# Patient Record
Sex: Male | Born: 1955 | Race: White | Hispanic: No | Marital: Married | Smoking: Former smoker
Health system: Southern US, Community
[De-identification: ages and names within clinical notes are randomized; demographics above are authoritative.]

## PROBLEM LIST (undated history)

## (undated) DIAGNOSIS — I5022 Chronic systolic (congestive) heart failure: Secondary | ICD-10-CM

## (undated) DIAGNOSIS — J189 Pneumonia, unspecified organism: Secondary | ICD-10-CM

## (undated) DIAGNOSIS — B029 Zoster without complications: Secondary | ICD-10-CM

## (undated) DIAGNOSIS — E7800 Pure hypercholesterolemia, unspecified: Secondary | ICD-10-CM

## (undated) DIAGNOSIS — J449 Chronic obstructive pulmonary disease, unspecified: Secondary | ICD-10-CM

## (undated) DIAGNOSIS — I10 Essential (primary) hypertension: Secondary | ICD-10-CM

## (undated) HISTORY — DX: Chronic systolic (congestive) heart failure: I50.22

## (undated) HISTORY — DX: Pneumonia, unspecified organism: J18.9

## (undated) HISTORY — DX: Chronic obstructive pulmonary disease, unspecified: J44.9

## (undated) HISTORY — PX: CORONARY ANGIOPLASTY: SHX604

## (undated) HISTORY — DX: Essential (primary) hypertension: I10

## (undated) HISTORY — PX: CARDIAC CATHETERIZATION: SHX172

## (undated) HISTORY — DX: Zoster without complications: B02.9

## (undated) HISTORY — PX: CORONARY STENT PLACEMENT: SHX1402

## (undated) HISTORY — PX: URETHROTOMY: SHX1083

## (undated) HISTORY — DX: Pure hypercholesterolemia, unspecified: E78.00

---

## 2004-03-12 ENCOUNTER — Other Ambulatory Visit: Payer: Self-pay

## 2004-07-05 ENCOUNTER — Emergency Department (HOSPITAL_COMMUNITY): Admission: EM | Admit: 2004-07-05 | Discharge: 2004-07-05 | Payer: Self-pay | Admitting: *Deleted

## 2004-07-31 ENCOUNTER — Other Ambulatory Visit: Payer: Self-pay

## 2007-07-19 ENCOUNTER — Other Ambulatory Visit: Payer: Self-pay

## 2010-01-20 ENCOUNTER — Emergency Department (HOSPITAL_COMMUNITY): Admission: EM | Admit: 2010-01-20 | Discharge: 2010-01-20 | Payer: Self-pay | Admitting: Emergency Medicine

## 2010-04-13 ENCOUNTER — Other Ambulatory Visit: Payer: Self-pay | Admitting: Emergency Medicine

## 2010-09-08 LAB — URINALYSIS, ROUTINE W REFLEX MICROSCOPIC
Nitrite: POSITIVE — AB
Protein, ur: NEGATIVE mg/dL
Specific Gravity, Urine: 1.014 (ref 1.005–1.030)
Urobilinogen, UA: 0.2 mg/dL (ref 0.0–1.0)

## 2010-09-08 LAB — URINE MICROSCOPIC-ADD ON

## 2010-09-08 LAB — URINE CULTURE

## 2010-11-18 ENCOUNTER — Inpatient Hospital Stay (HOSPITAL_COMMUNITY)
Admission: EM | Admit: 2010-11-18 | Discharge: 2010-11-21 | DRG: 853 | Disposition: A | Discharge status: Home / Self Care | Payer: BC Managed Care – PPO | Attending: Cardiovascular Disease | Admitting: Cardiovascular Disease

## 2010-11-18 DIAGNOSIS — J4489 Other specified chronic obstructive pulmonary disease: Secondary | ICD-10-CM | POA: Diagnosis present

## 2010-11-18 DIAGNOSIS — Z7982 Long term (current) use of aspirin: Secondary | ICD-10-CM

## 2010-11-18 DIAGNOSIS — N390 Urinary tract infection, site not specified: Secondary | ICD-10-CM | POA: Diagnosis present

## 2010-11-18 DIAGNOSIS — I2510 Atherosclerotic heart disease of native coronary artery without angina pectoris: Secondary | ICD-10-CM | POA: Diagnosis present

## 2010-11-18 DIAGNOSIS — E119 Type 2 diabetes mellitus without complications: Secondary | ICD-10-CM | POA: Diagnosis present

## 2010-11-18 DIAGNOSIS — I2119 ST elevation (STEMI) myocardial infarction involving other coronary artery of inferior wall: Principal | ICD-10-CM | POA: Diagnosis present

## 2010-11-18 DIAGNOSIS — I517 Cardiomegaly: Secondary | ICD-10-CM

## 2010-11-18 DIAGNOSIS — J449 Chronic obstructive pulmonary disease, unspecified: Secondary | ICD-10-CM | POA: Diagnosis present

## 2010-11-18 DIAGNOSIS — Z882 Allergy status to sulfonamides status: Secondary | ICD-10-CM

## 2010-11-18 DIAGNOSIS — F17200 Nicotine dependence, unspecified, uncomplicated: Secondary | ICD-10-CM | POA: Diagnosis present

## 2010-11-18 DIAGNOSIS — E785 Hyperlipidemia, unspecified: Secondary | ICD-10-CM | POA: Diagnosis present

## 2010-11-18 DIAGNOSIS — R079 Chest pain, unspecified: Secondary | ICD-10-CM

## 2010-11-18 DIAGNOSIS — Z888 Allergy status to other drugs, medicaments and biological substances status: Secondary | ICD-10-CM

## 2010-11-18 DIAGNOSIS — I10 Essential (primary) hypertension: Secondary | ICD-10-CM | POA: Diagnosis present

## 2010-11-18 LAB — DIFFERENTIAL
Eosinophils Absolute: 0.4 10*3/uL (ref 0.0–0.7)
Lymphocytes Relative: 25 % (ref 12–46)
Lymphs Abs: 2.9 10*3/uL (ref 0.7–4.0)
Neutro Abs: 7.4 10*3/uL (ref 1.7–7.7)
Neutrophils Relative %: 63 % (ref 43–77)

## 2010-11-18 LAB — CARDIAC PANEL(CRET KIN+CKTOT+MB+TROPI)
CK, MB: 15.7 ng/mL (ref 0.3–4.0)
Relative Index: INVALID (ref 0.0–2.5)
Total CK: 178 U/L (ref 7–232)
Troponin I: <0.3 ng/mL (ref <0.30)

## 2010-11-18 LAB — BASIC METABOLIC PANEL
Calcium: 8.8 mg/dL (ref 8.4–10.5)
Chloride: 102 mEq/L (ref 96–112)
Creatinine, Ser: 0.86 mg/dL (ref 0.4–1.5)
GFR calc Af Amer: >60 mL/min (ref >60)

## 2010-11-18 LAB — HEMOGLOBIN A1C: Mean Plasma Glucose: 128 mg/dL — ABNORMAL HIGH (ref <117)

## 2010-11-18 LAB — CBC
HCT: 41 % (ref 39.0–52.0)
Hemoglobin: 13.6 g/dL (ref 13.0–17.0)
MCV: 86.3 fL (ref 78.0–100.0)
MCV: 86.1 fL (ref 78.0–100.0)
Platelets: 332 10*3/uL (ref 150–400)
Platelets: 276 10*3/uL (ref 150–400)
RBC: 4.75 MIL/uL (ref 4.22–5.81)
RBC: 4.31 MIL/uL (ref 4.22–5.81)
WBC: 11.7 10*3/uL — ABNORMAL HIGH (ref 4.0–10.5)
WBC: 11.1 10*3/uL — ABNORMAL HIGH (ref 4.0–10.5)

## 2010-11-18 LAB — MRSA PCR SCREENING: MRSA by PCR: NEGATIVE

## 2010-11-18 LAB — COMPREHENSIVE METABOLIC PANEL
BUN: 10 mg/dL (ref 6–23)
CO2: 31 mEq/L (ref 19–32)
Calcium: 8.4 mg/dL (ref 8.4–10.5)
Creatinine, Ser: 0.84 mg/dL (ref 0.4–1.5)
GFR calc non Af Amer: >60 mL/min (ref >60)
Glucose, Bld: 141 mg/dL — ABNORMAL HIGH (ref 70–99)

## 2010-11-18 LAB — LIPID PANEL
HDL: 34 mg/dL — ABNORMAL LOW (ref >39)
LDL Cholesterol: 172 mg/dL — ABNORMAL HIGH (ref 0–99)
Triglycerides: 56 mg/dL (ref <150)

## 2010-11-18 LAB — GLUCOSE, CAPILLARY: Glucose-Capillary: 140 mg/dL — ABNORMAL HIGH (ref 70–99)

## 2010-11-18 LAB — APTT: aPTT: 27 seconds (ref 24–37)

## 2010-11-19 LAB — URINE MICROSCOPIC-ADD ON

## 2010-11-19 LAB — BASIC METABOLIC PANEL
CO2: 34 mEq/L — ABNORMAL HIGH (ref 19–32)
Chloride: 98 mEq/L (ref 96–112)
Creatinine, Ser: 0.89 mg/dL (ref 0.4–1.5)
GFR calc Af Amer: >60 mL/min (ref >60)
Potassium: 4.2 mEq/L (ref 3.5–5.1)

## 2010-11-19 LAB — URINALYSIS, ROUTINE W REFLEX MICROSCOPIC
Glucose, UA: NEGATIVE mg/dL
Protein, ur: NEGATIVE mg/dL
Specific Gravity, Urine: 1.026 (ref 1.005–1.030)
pH: 6 (ref 5.0–8.0)

## 2010-11-19 LAB — CBC
Hemoglobin: 12.9 g/dL — ABNORMAL LOW (ref 13.0–17.0)
MCH: 28.2 pg (ref 26.0–34.0)
MCHC: 32.2 g/dL (ref 30.0–36.0)
MCV: 87.7 fL (ref 78.0–100.0)
Platelets: 312 10*3/uL (ref 150–400)

## 2010-11-19 LAB — CARDIAC PANEL(CRET KIN+CKTOT+MB+TROPI): Troponin I: 3.23 ng/mL (ref <0.30)

## 2010-11-20 LAB — CBC
MCH: 28.3 pg (ref 26.0–34.0)
MCHC: 32.3 g/dL (ref 30.0–36.0)
MCV: 87.6 fL (ref 78.0–100.0)
Platelets: 288 10*3/uL (ref 150–400)
RDW: 13 % (ref 11.5–15.5)
WBC: 9.6 10*3/uL (ref 4.0–10.5)

## 2010-11-20 LAB — BASIC METABOLIC PANEL
BUN: 12 mg/dL (ref 6–23)
Calcium: 8.7 mg/dL (ref 8.4–10.5)
Creatinine, Ser: 0.91 mg/dL (ref 0.4–1.5)
GFR calc non Af Amer: >60 mL/min (ref >60)
Potassium: 4.1 mEq/L (ref 3.5–5.1)

## 2010-11-21 DIAGNOSIS — I2119 ST elevation (STEMI) myocardial infarction involving other coronary artery of inferior wall: Secondary | ICD-10-CM

## 2010-11-21 LAB — BASIC METABOLIC PANEL
BUN: 11 mg/dL (ref 6–23)
Calcium: 9 mg/dL (ref 8.4–10.5)
Creatinine, Ser: 0.81 mg/dL (ref 0.4–1.5)
GFR calc Af Amer: >60 mL/min (ref >60)
GFR calc non Af Amer: >60 mL/min (ref >60)

## 2010-11-21 NOTE — Cardiovascular Report (Signed)
[name], [name]                  ACCOUNT NO.:  [id number]  MEDICAL RECORD NO.:  [id number]           PATIENT TYPE:  LOCATION:                                 FACILITY:  PHYSICIAN:  Lorine Bears, MD     DATE OF BIRTH:  July 24, 1955  DATE OF PROCEDURE: DATE OF DISCHARGE:                           CARDIAC CATHETERIZATION   PROCEDURES PERFORMED: 1. Left heart catheterization. 2. Coronary angiography. 3. Coronary angioplasty and drug-eluting stent placement to the     distal, mid as well as proximal right coronary artery.  ACCESS:  Right radial artery.  INDICATIONS:  Acute inferior ST-elevation myocardial infarction.  CLINICAL HISTORY:  [name] is a 55 year old gentleman with past medical history of hypertension, hyperlipidemia, and known history of coronary artery disease status post multiple myocardial infarctions as well as multiple cardiac procedures and stent placements mostly on the right coronary artery.  Most of his previous angioplasty was performed at [facility].  The patient could not identify who his cardiologist is, but has not had a cardiac procedure in more than 2 years.  He continues to smoke.  The patient presented with acute onset chest pain, which started around 4 o'clock in the morning.  He called the EMS and ECG showed 2 mm of ST elevation in the inferior leads.  The patient was transferred for emergent cardiac catheterization.  The risks, benefits, and alternatives were discussed with the patient by me.  STUDY DETAILS:  The right radial artery was prepped in a sterile fashion.  It was anesthetized with 1% lidocaine.  A 6-French sheath was placed in the right radial artery.  3 mg of verapamil was given through the sheath.  IV bivalirudin was initiated with therapeutic ACT.  The patient already received 4000 units of heparin in the emergency room. He was later given 60 mg of Effient loading dose at the end of the case. Coronary  angiography was performed with an Ikari left guide which was used to engage the left main as well as the right coronary artery.  I then proceeded with interventional procedure on the right coronary artery which was the culprit.  Left ventricular angiography was not performed, but left ventricular pressure with pullback was recorded with a pigtail catheter.  INTERVENTIONAL PROCEDURE NOTE:  The right coronary artery was wired with a Prowater wire.  The RCA had multiple lesions in the distal, mid as well as the proximal.  The most distal lesion was 70% and was predilated with a 2.5 x 12 mm Apex balloon to 8 atmospheres.  The other distal lesion was in-stent and it was the culprit 99%.  It was dilated with the same balloon to 8 atmospheres.  I then tried to place a 2.5 x 28 mm Promus Element drug-eluting stent to cover both distal lesions, but could not pass the stent.  Thus, I used a BMW as a buddy wire.  The stent was then deployed to 14 atmospheres and reinflated to 16 atmospheres with same balloon.  The proximal part was postdilated with a 3.0 x 20 mm [place] Quantum balloon to 16 atmospheres.  I then placed a 3.0 x 16 mm Promus Element drug-eluting stent in the mid right coronary artery for an 80% lesion.  This was deployed at 16 atmospheres.  This was postdilated with 3.25 x 12 mm [place] Quantum to 16 atmospheres.  I then performed angiography after I pulled the wire to evaluate the proximal lesion which appeared to be significant after taking multiple views.  It was in-stent and estimated to be 70%.  I placed a 3.5 x 16 mm Promus Element stent and deployed into 16 atmospheres.  Angiography showed good results.  The patient tolerated the procedure well with no immediate complications.  STUDY FINDINGS:  Hemodynamic findings:  Left ventricular pressure is 122/21 with a left ventricular end-diastolic pressure of 27 mmHg. Aortic pressure is 115/78 with a mean pressure of 95 mmHg.  Coronary  Angiography: Right coronary artery:  The vessel has multiple stents throughout its course in the proximal as well as distal and the posterolateral branch. A 70% lesion is noted proximally and it is in-stent restenosis.  There is a lesion in the mid RCA which is between 2 stents and it is estimated to be 80% and is discrete.  Distally, there is a 99% stenosis inside a previously placed stent which is likely the culprit.  There is another distal lesion right at the bifurcation which is 70%.  The stent in the posterolateral branch is patent with mild restenosis.  Left main coronary artery:  The vessel is normal in size and has no significant disease.  Left anterior descending artery:  The vessel was overall medium in size. The stent is noted in the proximal segment which is patent with 20% in- stent restenosis.  A 30% lesion is noted in the mid segment right after the first septal branch.  There is a second lesion before a diagonal branch in the mid segment which is 40%.  The rest of the LAD has mild diffuse atherosclerosis without obstructive disease.  Left circumflex artery:  The vessel is medium in size.  A 30% lesion is noted right after OM-1 and the rest of the vessel has mild diffuse atherosclerosis without obstructive disease.  OM-1 has a 20% lesion.  STUDY CONCLUSIONS: 1. Acute inferior ST-elevation myocardial infarction due to subtotal     occlusion of distal right coronary artery with other significant     lesions in the mid as well as proximal right coronary artery mostly     due to in-stent restenosis. 2. Successful angioplasty and drug-eluting stent placement x3 to the     distal, mid as well as the proximal right coronary artery with 3     drug-eluting stents. 3. Patent stent in the left anterior descending artery with no other     obstructive disease. 4. Moderately elevated left ventricular end-diastolic pressure.  RECOMMENDATIONS: 1. I recommend aspirin daily  indefinitely.  We will switch the patient     from Plavix to Effient 10 mg once daily, which should be continued     for at least 1 year and preferably lifelong given that he has     multiple drug-eluting stents in the right coronary artery. 2. Smoking cessation is strongly advised. 3. Two-D echo to evaluate left ventricular function. 4. Aggressive treatment of risk factors.     Lorine Bears, MD     MA/MEDQ  D:  11/18/2010  T:  11/18/2010  Job:  [id number]  Electronically Signed by Lorine Bears MD on 11/21/2010 04:16:21 PM

## 2010-12-21 NOTE — Discharge Summary (Signed)
[name], [name]                  ACCOUNT NO.:  [id number]  MEDICAL RECORD NO.:  [id number]           PATIENT TYPE:  I  LOCATION:  2031                         FACILITY:  [facility]  PHYSICIAN:  Verne Carrow, MDDATE OF BIRTH:  1956/03/09  DATE OF ADMISSION:  11/18/2010 DATE OF DISCHARGE:  11/21/2010                              DISCHARGE SUMMARY   PRIMARY CARDIOLOGIST:  Dr. Ave Filter at [facility].  DISCHARGE DIAGNOSES: 1. Acute inferior ST-elevation myocardial infarction status post     angioplasty and drug-eluting stent placement x3 to the distal     common mid as well as proximal right coronary artery. 2. Coronary artery disease status post multiple cardiac procedures and     stent placement mostly to the right coronary artery with previous     angioplasty performed at [facility]. 3. Chronic obstructive pulmonary disease, wears home O2 at night.     a.     No beta-blocker use. 4. Uncomplicated urinary tract infection.     a.     Completed Cipro course this admission.  SECONDARY DIAGNOSES: 1. Tobacco abuse. 2. Hyperlipidemia. 3. Hypertension. 4. Borderline diabetes mellitus.  ALLERGIES:  AVELOX and SULFA.  PROCEDURES/DIAGNOSTICS PERFORMED DURING HOSPITALIZATION: 1. Left heart catheterization with coronary angiography and coronary     angioplasty as well as drug-eluting stent placement.     a.     Successful angioplasty and drug-eluting stent placement x3      to the distal, mid, and proximal right coronary artery.  Proximal      segment with 70% in-stent restenosis status post Promus element      drug-eluting stent, 3.5 x 16 mm.  Midportion of the RCA with 80%      stenosis status post 3.0 x 16-mm Promus element drug-eluting      stent.  Distal RCA with 99% stenosis at site of previously placed      stents status post 2.5 x 28-mm Promus element drug-eluting stent.     b.     Patent stent in the left anterior descending artery with no   other obstructive disease.     c.     Moderately elevated left ventricular end-diastolic pressure. 2. Echocardiogram on Nov 18, 2010:  Left ventricle showed wall     thickness demonstrating mild LVH.  Systolic function was mildly to     moderately reduced with an estimated ejection fraction between 40-     45%.  There is akinesis of the inferior and posterior myocardium.  REASON FOR HOSPITALIZATION:  This is a 55 year old gentleman with known history of coronary artery disease.  He states he has been doing relatively well since his last cardiac procedure 2 years ago until day of admission.  He awoke with acute onset of chest pain approximately 4:00 a.m. in the morning.  He called EMS where an EKG showed 2-mm ST elevation in the inferior leads.  A code STEMI was initiated and the patient was brought emergently to the [facility] Cardiac Cath Lab.  Risks, benefits, and indications for catheterization were discussed with the patient and he agreed  to proceed.  Of note, the patient was given a heparin bolus upon arrival to [facility] as well as aspirin in the field.  HOSPITAL COURSE:  The patient was brought emergently to the cath lab by Dr. Kirke Corin, emergent consent was obtained.  As above there was noted to be acute inferior ST-elevation myocardial infarction due to subtotal occlusion of distal right coronary artery with other significant lesions in the mid as well as proximal right coronary artery, mostly due to in- stent restenosis.  Dr. Kirke Corin performed successful angioplasty and drug- eluting stent placement x3 to the distal, mid as well as proximal right coronary artery.  The patient had a patent stent in the left anterior descending artery with no other obstructive disease.  The patient tolerated the catheterization well.  He was taken to the CCU post catheterization in stable condition.  The patient has been changed from his home dose of Plavix to Effient 10 mg daily, this should be continued for  least a year but preferably lifelong given he has multiple drug-eluting stents to his right coronary artery.  Aspirin should also be continued indefinitely.  The patient was having moderately elevated left ventricle and diastolic pressures, 27 mmHg.  A 2-D echocardiogram was obtained.  This was completed on May 27. This demonstrated left ventricle with mildly to moderately reduced systolic function, estimated ejection fraction of 40-45%.  There was akinesis of the inferior posterior myocardium noted.  With the patient's COPD, he is unable to tolerate beta-blockers.  Therefore, low-dose ACE inhibitor has been added to his medical regimen, and this will be continued at discharge.  Cardiac enzymes were cycled and the patient's troponin peaked at 3.23.  The patient had mild right sided chest soreness.  Chest pain improved post catheterization.  As far as COPD, he does use home oxygen, but mostly at night.  During admission, the patient was noted to have sats down to 86%.  Therefore, oxygen needs was increased.  He will try albuterol as well as a short term on Ventimask at 50%.  Post albuterol use, the patient's O2 saturation did improve.  He was continued on his Spiriva.  The patient did ambulate with cardiac rehab where he was noted to be 80% on room air.  After discussion with the patient and Dr. Clifton James, it was noted that the patient does not wish for continuous O2 at home and is to wear only as needed as well as at night.  Further discussion of continuous O2 can be discussed with a primary cardiologist with outpatient followup.  The patient was complaining of dysuria and the urinalysis was obtained. This was positive for nitrites and leukocytes.  Therefore, the patient was placed on Cipro for 3 days, this course has been completed during admission.  The symptoms have improved.  On the day of discharge, Dr. Clifton James evaluated the patient and noted him stable for home.  His right wrist  cath site was without hematoma. He was ambulating without difficulty.  Again his O2 saturation had decreased 88% with ambulation, although the patient does not wish to wear continuous O2 at this time.  A tobacco cessation consult was obtained, the patient states he only smokes occasionally but again this has been discouraged.  Discharge plans and instructions were discussed with the patient and he voiced understanding.  DISCHARGE LABS:  Sodium 137, potassium 3.9, BUN 11, creatinine 0.81.  DISCHARGE MEDICATIONS: 1. Lisinopril 5 mg 1 tablet daily. 2. Effient 10 mg 1 tablet daily. 3. Crestor 40 mg  1 tablet daily. 4. Aspirin 81 mg 1 tablet daily. 5. Albuterol nebulization 1 vial inhaled every 6 hours as needed for     shortness of breath. 6. Ipratropium/albuterol inhaler 1 puff inhaled 4 times a day as     needed for shortness of breath. 7. Nexium 40 mg 1 capsule daily as needed for stomach acid. 8. Nitroglycerin sublingual 0.4 mg 1 tablet under tongue at the onset     of chest pain and may repeat every 5 minutes up to 3 doses as     needed. 9. Spiriva 18 mcg 1 capsule inhaled daily. 10.Albuterol inhaler 1 puff inhaled every 6 hours as needed for     shortness of breath. 11.Zolpidem 10 mg 1 tablet daily at bedtime as needed for insomnia. 12.Please stop taking Plavix 75 mg daily.  FOLLOWUP PLANS AND INSTRUCTIONS: 1. The patient is to follow up with Dr. Burnett Sheng at [facility] in 2-3 weeks, and the patient will call for this     appointment. 2. The patient is to increase activity slowly.  He may shower but no     bathing.  No lifting for 1 week greater than 5 pounds.  No driving     for 1 day.  No sexual activity for 1 week.  He is to keep his cath     site clean and dry and to call the office for any problems. 3. The patient is to continue low-sodium heart-healthy diet. 4. The patient is to avoid straining and stop activity that causes     chest pain or shortness  of breath. 5. The patient is to discontinue all tobacco use. 6. The patient is to call the office in the interim for any problems     or concerns.  DURATION OF DISCHARGE:  Greater than 30 minutes with physician and physician extender time.     Leonette Monarch, PA-C   ______________________________ Verne Carrow, MD    NB/MEDQ  D:  11/21/2010  T:  11/21/2010  Job:  [id number]  cc:   Ave Filter  Electronically Signed by Alen Blew P.A. on 12/16/2010 04:51:08 PM Electronically Signed by Verne Carrow MD on 12/21/2010 01:46:07 PM

## 2011-03-25 DIAGNOSIS — B029 Zoster without complications: Secondary | ICD-10-CM

## 2011-03-25 HISTORY — DX: Zoster without complications: B02.9

## 2011-04-08 ENCOUNTER — Other Ambulatory Visit: Payer: Self-pay

## 2011-10-21 ENCOUNTER — Encounter: Payer: Self-pay | Admitting: *Deleted

## 2011-10-25 ENCOUNTER — Encounter: Payer: Self-pay | Admitting: Cardiovascular Disease

## 2011-10-25 ENCOUNTER — Ambulatory Visit (INDEPENDENT_AMBULATORY_CARE_PROVIDER_SITE_OTHER): Payer: BC Managed Care – PPO | Admitting: Cardiovascular Disease

## 2011-10-25 VITALS — BP 132/85 | HR 97 | Ht 71.0 in | Wt 223.0 lb

## 2011-10-25 DIAGNOSIS — I2510 Atherosclerotic heart disease of native coronary artery without angina pectoris: Secondary | ICD-10-CM

## 2011-10-25 DIAGNOSIS — E7800 Pure hypercholesterolemia, unspecified: Secondary | ICD-10-CM

## 2011-10-25 DIAGNOSIS — I10 Essential (primary) hypertension: Secondary | ICD-10-CM

## 2011-10-25 DIAGNOSIS — Z5181 Encounter for therapeutic drug level monitoring: Secondary | ICD-10-CM

## 2011-10-25 NOTE — Patient Instructions (Signed)
You are scheduled for a left heart cath on Oct 28, 2011 with Dr. Kirke Corin.  Please review your instructions that were given to you.

## 2011-10-26 LAB — CBC WITH DIFFERENTIAL/PLATELET
Basos: 1 % (ref 0–3)
Eos: 2 % (ref 0–7)
Eosinophils Absolute: 0.2 10*3/uL (ref 0.0–0.4)
Immature Grans (Abs): 0 10*3/uL (ref 0.0–0.1)
Immature Granulocytes: 0 % (ref 0–2)
Lymphs: 26 % (ref 14–46)
Monocytes Absolute: 0.5 10*3/uL (ref 0.1–1.0)
Neutrophils Relative %: 64 % (ref 40–74)
RBC: 5.36 x10E6/uL (ref 4.14–5.80)
RDW: 13.9 % (ref 12.3–15.4)
WBC: 8 10*3/uL (ref 4.0–10.5)

## 2011-10-26 LAB — BASIC METABOLIC PANEL
BUN: 13 mg/dL (ref 6–24)
CO2: 24 mmol/L (ref 20–32)
Chloride: 94 mmol/L — ABNORMAL LOW (ref 97–108)
Glucose: 117 mg/dL — ABNORMAL HIGH (ref 65–99)
Potassium: 4.7 mmol/L (ref 3.5–5.2)

## 2011-10-26 LAB — PROTIME-INR: INR: 1 (ref 0.8–1.2)

## 2011-10-27 ENCOUNTER — Encounter: Payer: Self-pay | Admitting: Cardiovascular Disease

## 2011-10-27 DIAGNOSIS — I2510 Atherosclerotic heart disease of native coronary artery without angina pectoris: Secondary | ICD-10-CM | POA: Insufficient documentation

## 2011-10-27 DIAGNOSIS — I10 Essential (primary) hypertension: Secondary | ICD-10-CM | POA: Insufficient documentation

## 2011-10-27 DIAGNOSIS — E7800 Pure hypercholesterolemia, unspecified: Secondary | ICD-10-CM | POA: Insufficient documentation

## 2011-10-27 NOTE — Assessment & Plan Note (Signed)
He had a recent lipid profile which showed an LDL of 128, triglyceride of 101 and an HDL of 45. This is improved since his previous lipid profile which showed an LDL of 175. He might need an additional medication to Crestor which is currently at the maximum dose.

## 2011-10-27 NOTE — Assessment & Plan Note (Signed)
[name] is having significant exertional chest tightness and dyspnea consistent with class III angina. He has known history of coronary artery disease with multiple stent placements. He is at high risk for in-stent restenosis. Due to severity of his symptoms, I recommend proceeding with cardiac catheterization and possible coronary intervention. Risks, benefits and alternatives were discussed with him and his wife. Continue treatment with aspirin and Effient.

## 2011-10-27 NOTE — Progress Notes (Signed)
HPI  [name] is a 56 year old male who is here today to establish cardiovascular care. He has extensive history of ischemic heart disease with multiple myocardial infarctions and stent placements in the past mostly in the RCA and one stent in the LAD. Most of these were done at [facility]. He presented in May of 2012 with an acute inferior myocardial infarction. I treated him at that time at [facility]. He underwent emergent cardiac catheterization which showed subtotal occlusion of the distal right coronary artery. He was also found to have significant disease in the mid and proximal RCA. He had a successful angioplasty and 3 drug-eluting stent placements without complications. He did well and was discharged after a few days. His previous symptoms of angina improved completely and continued to do well until October of 2012 when he had shingles in the chest area which resulted in significant discomfort and stress. After he recovered from that, he noticed progressive exertional chest tightness and dyspnea similar to his previous angina. These symptoms have worsened significantly to the point of happening just walking to the mailbox. He is not able to do any activities of daily living without getting some chest discomfort and tightness.  Allergies  Allergen Reactions  . Avelox (Moxifloxacin) Anaphylaxis  . Ciprofloxacin   . Sulfamethoxazole      Current Outpatient Prescriptions on File Prior to Visit  Medication Sig Dispense Refill  . albuterol (PROVENTIL HFA;VENTOLIN HFA) 108 (90 BASE) MCG/ACT inhaler Inhale 2 puffs into the lungs every 6 (six) hours as needed.      Marland Kitchen albuterol (PROVENTIL HFA;VENTOLIN HFA) 108 (90 BASE) MCG/ACT inhaler Inhale 2 puffs into the lungs every 6 (six) hours as needed.      Marland Kitchen albuterol-ipratropium (COMBIVENT) 18-103 MCG/ACT inhaler Inhale 2 puffs into the lungs every 6 (six) hours as needed.      Marland Kitchen aspirin 325 MG tablet Take 325 mg by mouth daily.      . prasugrel  (EFFIENT) 10 MG TABS Take by mouth daily.      . rosuvastatin (CRESTOR) 40 MG tablet Take 40 mg by mouth daily.      . Tamsulosin HCl (FLOMAX) 0.4 MG CAPS Take 0.4 mg by mouth daily.      Marland Kitchen tiotropium (SPIRIVA) 18 MCG inhalation capsule Place 18 mcg into inhaler and inhale daily.      Marland Kitchen zolpidem (AMBIEN) 10 MG tablet Take 10 mg by mouth at bedtime as needed.      . digoxin (LANOXIN) 0.25 MG tablet Take 250 mcg by mouth daily.      Marland Kitchen esomeprazole (NEXIUM) 40 MG capsule Take 40 mg by mouth daily before breakfast.      . spironolactone (ALDACTONE) 25 MG tablet Take 25 mg by mouth daily.         Past Medical History  Diagnosis Date  . Diabetes mellitus   . Coronary atherosclerosis     9 stents, 8-10 "mild" heart attacks. Inferior MI in 10/2010: patent LAD stent with mild disease, Mild LCX disease, RCA: 99% distal ISR, 80% mid and 70% proximal ISR. 3 DESs to RCA. 2.5 X28 Promus, 3.0 X16 and 3.5X16   . Pure hypercholesterolemia   . Essential hypertension, benign   . COPD (chronic obstructive pulmonary disease)   . Shingles 03/2011     Past Surgical History  Procedure Date  . Coronary stent placement     9 stents  . Cardiac catheterization   . Coronary angioplasty  Family History  Problem Relation Age of Onset  . Asthma      family history  . Coronary artery disease Father   . Stroke      grandparent  . Diabetes      family history  . Cancer      grandmother     History   Social History  . Marital Status: Married    Spouse Name: N/A    Number of Children: 2  . Years of Education: N/A   Occupational History  . [employer]    Social History Main Topics  . Smoking status: Former Games developer  . Smokeless tobacco: Not on file  . Alcohol Use: No  . Drug Use: No  . Sexually Active: Not on file   Other Topics Concern  . Not on file   Social History Narrative  . No narrative on file     PHYSICAL EXAM   BP 132/85  Pulse 97  Ht 5\' 11"  (1.803 m)   Wt 223 lb (101.152 kg)  BMI 31.10 kg/m2  Constitutional: He is oriented to person, place, and time. He appears well-developed and well-nourished. No distress.  HENT: No nasal discharge.  Head: Normocephalic and atraumatic.  Eyes: Pupils are equal and round. Right eye exhibits no discharge. Left eye exhibits no discharge.  Neck: Normal range of motion. Neck supple. No JVD present. No thyromegaly present.  Cardiovascular: Normal rate, regular rhythm, normal heart sounds. Exam reveals no gallop and no friction rub. No murmur heard.  Pulmonary/Chest: Effort normal. Diminished breath sounds. No stridor. No respiratory distress. He has no wheezes. He has no rales. He exhibits no tenderness.  Abdominal: Soft. Bowel sounds are normal. He exhibits no distension. There is no tenderness. There is no rebound and no guarding.  Musculoskeletal: Normal range of motion. He exhibits no edema and no tenderness.  Neurological: He is alert and oriented to person, place, and time. Coordination normal.  Skin: Skin is warm and dry. No rash noted. He is not diaphoretic. No erythema. No pallor.  Psychiatric: He has a normal mood and affect. His behavior is normal. Judgment and thought content normal.      EKG: Normal sinus rhythm with no significant ST or T wave changes. Inferior Q waves.   ASSESSMENT AND PLAN

## 2011-10-28 ENCOUNTER — Encounter: Payer: Self-pay | Admitting: Cardiovascular Disease

## 2011-10-28 DIAGNOSIS — I2510 Atherosclerotic heart disease of native coronary artery without angina pectoris: Secondary | ICD-10-CM

## 2011-11-07 ENCOUNTER — Encounter: Payer: Self-pay | Admitting: Cardiovascular Disease

## 2011-11-07 ENCOUNTER — Ambulatory Visit (INDEPENDENT_AMBULATORY_CARE_PROVIDER_SITE_OTHER): Payer: BC Managed Care – PPO | Admitting: Cardiovascular Disease

## 2011-11-07 VITALS — BP 120/80 | HR 80 | Ht 71.0 in | Wt 225.0 lb

## 2011-11-07 DIAGNOSIS — R5381 Other malaise: Secondary | ICD-10-CM

## 2011-11-07 DIAGNOSIS — I2510 Atherosclerotic heart disease of native coronary artery without angina pectoris: Secondary | ICD-10-CM

## 2011-11-07 DIAGNOSIS — R0602 Shortness of breath: Secondary | ICD-10-CM

## 2011-11-07 DIAGNOSIS — E7800 Pure hypercholesterolemia, unspecified: Secondary | ICD-10-CM

## 2011-11-07 DIAGNOSIS — I5022 Chronic systolic (congestive) heart failure: Secondary | ICD-10-CM | POA: Insufficient documentation

## 2011-11-07 DIAGNOSIS — R5383 Other fatigue: Secondary | ICD-10-CM

## 2011-11-07 MED ORDER — METOPROLOL SUCCINATE ER 25 MG PO TB24: 25.0000 mg | ORAL_TABLET | Freq: Every day | ORAL | Status: DC

## 2011-11-07 MED ORDER — LISINOPRIL 5 MG PO TABS: 5.0000 mg | ORAL_TABLET | Freq: Every day | ORAL | Status: DC

## 2011-11-07 NOTE — Assessment & Plan Note (Signed)
His recent cardiac catheterization showed patent stents without new obstructive disease. I recommend continuing current treatment with dual antiplatelet therapy due to multiple cardiac stents. Continue aggressive treatment of risk factors.

## 2011-11-07 NOTE — Assessment & Plan Note (Signed)
He had a recent lipid profile which showed an LDL of 128, triglyceride of 101 and an HDL of 45. This is improved since his previous lipid profile which showed an LDL of 175.

## 2011-11-07 NOTE — Progress Notes (Signed)
HPI  [name] is a 56 year old male who is here today for a followup visit after recent cardiac catheterization via the right radial artery. His cardiac catheterization showed patent stents with mild to moderate nonobstructive coronary artery disease overall. Ejection fraction was 35% with a left ventricular end-diastolic pressure of 19 mm mercury. The patient has been doing reasonably well. He continues to complain of exertional dyspnea. His chest pain seems to be slightly better than before. He does have COPD. It appears that he used to be on a beta blocker and an ACE inhibitor in the past which probably were discontinued due to hypotension.  Allergies  Allergen Reactions  . Avelox (Moxifloxacin) Anaphylaxis  . Ciprofloxacin   . Sulfamethoxazole      Current Outpatient Prescriptions on File Prior to Visit  Medication Sig Dispense Refill  . albuterol (PROVENTIL HFA;VENTOLIN HFA) 108 (90 BASE) MCG/ACT inhaler Inhale 2 puffs into the lungs every 6 (six) hours as needed.      Marland Kitchen albuterol (PROVENTIL HFA;VENTOLIN HFA) 108 (90 BASE) MCG/ACT inhaler Inhale 2 puffs into the lungs every 6 (six) hours as needed.      Marland Kitchen albuterol-ipratropium (COMBIVENT) 18-103 MCG/ACT inhaler Inhale 2 puffs into the lungs every 6 (six) hours as needed.      Marland Kitchen aspirin 325 MG tablet Take 325 mg by mouth daily.      . digoxin (LANOXIN) 0.25 MG tablet Take 250 mcg by mouth daily.      Marland Kitchen esomeprazole (NEXIUM) 40 MG capsule Take 40 mg by mouth daily before breakfast.      . prasugrel (EFFIENT) 10 MG TABS Take by mouth daily.      . rosuvastatin (CRESTOR) 40 MG tablet Take 40 mg by mouth daily.      Marland Kitchen spironolactone (ALDACTONE) 25 MG tablet Take 25 mg by mouth daily.      . Tamsulosin HCl (FLOMAX) 0.4 MG CAPS Take 0.4 mg by mouth daily.      Marland Kitchen tiotropium (SPIRIVA) 18 MCG inhalation capsule Place 18 mcg into inhaler and inhale daily.      Marland Kitchen zolpidem (AMBIEN) 10 MG tablet Take 10 mg by mouth at bedtime as needed.      Marland Kitchen  lisinopril (PRINIVIL,ZESTRIL) 5 MG tablet Take 1 tablet (5 mg total) by mouth daily.  30 tablet  6  . metoprolol succinate (TOPROL XL) 25 MG 24 hr tablet Take 1 tablet (25 mg total) by mouth daily.  30 tablet  6     Past Medical History  Diagnosis Date  . Diabetes mellitus   . Pure hypercholesterolemia   . Essential hypertension, benign   . COPD (chronic obstructive pulmonary disease)   . Shingles 03/2011  . Coronary atherosclerosis     9 stents, 8-10 "mild" heart attacks. Inferior MI in 10/2010: patent LAD stent with mild disease, Mild LCX disease, RCA: 99% distal ISR, 80% mid and 70% proximal ISR. 3 DESs to RCA. 2.5 X28 Promus, 3.0 X16 and 3.5X16 . Cardiac cath 10/2011: patent stents with mild-moderate disease overall. EF 35%.   . Chronic systolic CHF (congestive heart failure)      Past Surgical History  Procedure Date  . Coronary stent placement     9 stents  . Cardiac catheterization   . Coronary angioplasty      Family History  Problem Relation Age of Onset  . Asthma      family history  . Coronary artery disease Father   . Stroke  grandparent  . Diabetes      family history  . Cancer      grandmother     History   Social History  . Marital Status: Married    Spouse Name: N/A    Number of Children: 2  . Years of Education: N/A   Occupational History  . [employer]    Social History Main Topics  . Smoking status: Former Games developer  . Smokeless tobacco: Not on file  . Alcohol Use: No  . Drug Use: No  . Sexually Active: Not on file   Other Topics Concern  . Not on file   Social History Narrative  . No narrative on file      PHYSICAL EXAM   BP 120/80  Pulse 80  Ht 5\' 11"  (1.803 m)  Wt 225 lb (102.059 kg)  BMI 31.38 kg/m2  Constitutional: He is oriented to person, place, and time. He appears well-developed and well-nourished. No distress.  HENT: No nasal discharge.  Head: Normocephalic and atraumatic.  Eyes: Pupils are  equal and round. Right eye exhibits no discharge. Left eye exhibits no discharge.  Neck: Normal range of motion. Neck supple. No JVD present. No thyromegaly present.  Cardiovascular: Normal rate, regular rhythm, normal heart sounds and. Exam reveals no gallop and no friction rub. No murmur heard.  Pulmonary/Chest: Diminished breath sounds. No stridor. No respiratory distress. He has no wheezes. He has no rales. He exhibits no tenderness.  Abdominal: Soft. Bowel sounds are normal. He exhibits no distension. There is no tenderness. There is no rebound and no guarding.  Musculoskeletal: Normal range of motion. He exhibits no edema and no tenderness.  Neurological: He is alert and oriented to person, place, and time. Coordination normal.  Skin: Skin is warm and dry. No rash noted. He is not diaphoretic. No erythema. No pallor.  Psychiatric: He has a normal mood and affect. His behavior is normal. Judgment and thought content normal.    EKG: Sinus  Rhythm  -Short PR syndrome  PRi = 112 -  Nonspecific T-abnormality.     ASSESSMENT AND PLAN

## 2011-11-07 NOTE — Assessment & Plan Note (Signed)
His ejection fraction was 35%. There is a strong indication for an ACE inhibitor and a beta blocker. Thus, I will start him on Toprol 25 mg once daily lisinopril 5 mg once daily. I will request basic metabolic profile to be done in one week. He is to followup in 2 months to  see if these medications can be uptitrated.

## 2011-11-07 NOTE — Patient Instructions (Signed)
Start Lisinopril 5 mg once daily.  Start Toprol XL 25 mg once daily.   You need to have labs done in 1 week (BMP)  Follow up in 2 months.

## 2012-01-07 ENCOUNTER — Ambulatory Visit: Payer: BC Managed Care – PPO | Admitting: Cardiovascular Disease

## 2012-01-14 ENCOUNTER — Ambulatory Visit (INDEPENDENT_AMBULATORY_CARE_PROVIDER_SITE_OTHER): Payer: BC Managed Care – PPO | Admitting: Cardiovascular Disease

## 2012-01-14 ENCOUNTER — Encounter: Payer: Self-pay | Admitting: Cardiovascular Disease

## 2012-01-14 VITALS — BP 128/68 | HR 81 | Ht 71.0 in | Wt 231.5 lb

## 2012-01-14 DIAGNOSIS — I10 Essential (primary) hypertension: Secondary | ICD-10-CM

## 2012-01-14 DIAGNOSIS — R079 Chest pain, unspecified: Secondary | ICD-10-CM

## 2012-01-14 DIAGNOSIS — I5022 Chronic systolic (congestive) heart failure: Secondary | ICD-10-CM

## 2012-01-14 DIAGNOSIS — I2510 Atherosclerotic heart disease of native coronary artery without angina pectoris: Secondary | ICD-10-CM

## 2012-01-14 DIAGNOSIS — E7800 Pure hypercholesterolemia, unspecified: Secondary | ICD-10-CM

## 2012-01-14 DIAGNOSIS — I509 Heart failure, unspecified: Secondary | ICD-10-CM

## 2012-01-14 MED ORDER — LISINOPRIL 10 MG PO TABS: 10.0000 mg | ORAL_TABLET | Freq: Every day | ORAL | Status: DC

## 2012-01-14 NOTE — Assessment & Plan Note (Signed)
He seems to be stable overall. He appears to be euvolemic. I will increase the dose of lisinopril to 10 mg once daily. Check basic metabolic profile today.

## 2012-01-14 NOTE — Patient Instructions (Addendum)
Increase Lisinopril to 10 mg once daily (can take 2 tablets of 5 mg).  Labs today.  Follow up in 3 months.

## 2012-01-14 NOTE — Assessment & Plan Note (Signed)
He gets occasional chest pain which is usually brief period has not required nitroglycerin. Continue medical therapy.

## 2012-01-14 NOTE — Progress Notes (Signed)
HPI  [name] is a 56 year old male who is here today for a followup visit. He has known history of coronary artery disease status post stenting of both LAD and RCA. He had an inferior myocardial infarction in 2012 which was treated with angioplasty and stent placement. He is known to have ischemic cardiomyopathy as well. He had recent cardiac catheterization due to chest pain and dyspnea which showed patent stents with mild to moderate nonobstructive coronary artery disease overall. Ejection fraction was 35% with a left ventricular end-diastolic pressure of 19 mm mercury. During last visit, I started him on small dose Toprol as well as lisinopril. His dyspnea is mostly due to COPD. Overall, he has been doing reasonably well. He gets occasional chest pain which is brief. He has been spending most of the summer at home to avoid the heat outside. He enjoys playing with his 74 year old son at home.  Allergies  Allergen Reactions  . Avelox (Moxifloxacin) Anaphylaxis  . Ciprofloxacin   . Sulfamethoxazole      Current Outpatient Prescriptions on File Prior to Visit  Medication Sig Dispense Refill  . albuterol (PROVENTIL HFA;VENTOLIN HFA) 108 (90 BASE) MCG/ACT inhaler Inhale 2 puffs into the lungs every 6 (six) hours as needed.      Marland Kitchen albuterol-ipratropium (COMBIVENT) 18-103 MCG/ACT inhaler Inhale 2 puffs into the lungs every 6 (six) hours as needed.      . digoxin (LANOXIN) 0.25 MG tablet Take 250 mcg by mouth daily.      Marland Kitchen esomeprazole (NEXIUM) 40 MG capsule Take 40 mg by mouth daily before breakfast.      . metoprolol succinate (TOPROL XL) 25 MG 24 hr tablet Take 1 tablet (25 mg total) by mouth daily.  30 tablet  6  . prasugrel (EFFIENT) 10 MG TABS Take by mouth daily.      . rosuvastatin (CRESTOR) 40 MG tablet Take 40 mg by mouth daily.      Marland Kitchen spironolactone (ALDACTONE) 25 MG tablet Take 25 mg by mouth daily.      . Tamsulosin HCl (FLOMAX) 0.4 MG CAPS Take 0.4 mg by mouth daily.      Marland Kitchen  tiotropium (SPIRIVA) 18 MCG inhalation capsule Place 18 mcg into inhaler and inhale daily.      Marland Kitchen zolpidem (AMBIEN) 10 MG tablet Take 10 mg by mouth at bedtime as needed.      Marland Kitchen DISCONTD: albuterol (PROVENTIL HFA;VENTOLIN HFA) 108 (90 BASE) MCG/ACT inhaler Inhale 2 puffs into the lungs every 6 (six) hours as needed.         Past Medical History  Diagnosis Date  . Diabetes mellitus   . Pure hypercholesterolemia   . Essential hypertension, benign   . COPD (chronic obstructive pulmonary disease)   . Shingles 03/2011  . Chronic systolic CHF (congestive heart failure)   . Coronary atherosclerosis     9 stents, 8-10 "mild" heart attacks. Inferior MI in 10/2010: patent LAD stent with mild disease, Mild LCX disease, RCA: 99% distal ISR, 80% mid and 70% proximal ISR. 3 DESs to RCA. 2.5 X28 Promus, 3.0 X16 and 3.5X16 . Cardiac cath 10/2011: patent stents with mild-moderate disease overall. EF 35%.      Past Surgical History  Procedure Date  . Coronary stent placement     9 stents  . Cardiac catheterization   . Coronary angioplasty      Family History  Problem Relation Age of Onset  . Asthma      family history  .  Coronary artery disease Father   . Stroke      grandparent  . Diabetes      family history  . Cancer      grandmother     History   Social History  . Marital Status: Married    Spouse Name: N/A    Number of Children: 2  . Years of Education: N/A   Occupational History  . [employer]    Social History Main Topics  . Smoking status: Former Smoker -- 1.0 packs/day for 30 years    Quit date: 01/13/2009  . Smokeless tobacco: Not on file  . Alcohol Use: No  . Drug Use: No  . Sexually Active: Not on file   Other Topics Concern  . Not on file   Social History Narrative  . No narrative on file     PHYSICAL EXAM   BP 128/68  Pulse 81  Ht 5\' 11"  (1.803 m)  Wt 231 lb 8 oz (105.008 kg)  BMI 32.29 kg/m2  Constitutional: He is oriented to  person, place, and time. He appears well-developed and well-nourished. No distress.  HENT: No nasal discharge.  Head: Normocephalic and atraumatic.  Eyes: Pupils are equal and round. Right eye exhibits no discharge. Left eye exhibits no discharge.  Neck: Normal range of motion. Neck supple. No JVD present. No thyromegaly present.  Cardiovascular: Normal rate, regular rhythm, normal heart sounds and. Exam reveals no gallop and no friction rub. No murmur heard.  Pulmonary/Chest: Effort normal and diminished breath sounds. No stridor. No respiratory distress. He has no wheezes. He has no rales. He exhibits no tenderness.  Abdominal: Soft. Bowel sounds are normal. He exhibits no distension. There is no tenderness. There is no rebound and no guarding.  Musculoskeletal: Normal range of motion. He exhibits no edema and no tenderness.  Neurological: He is alert and oriented to person, place, and time. Coordination normal.  Skin: Skin is warm and dry. No rash noted. He is not diaphoretic. No erythema. No pallor.  Psychiatric: He has a normal mood and affect. His behavior is normal. Judgment and thought content normal.        ASSESSMENT AND PLAN

## 2012-01-14 NOTE — Assessment & Plan Note (Signed)
His blood pressure is well controlled and he seems to be tolerating Toprol and lisinopril.

## 2012-01-14 NOTE — Assessment & Plan Note (Signed)
Continue high-dose Crestor due to his known history of severe hyperlipidemia. I recommend a target LDL of less than 70. He is going to followup with Dr. Dossie Arbour next month.

## 2012-01-15 LAB — BASIC METABOLIC PANEL
BUN/Creatinine Ratio: 13 (ref 9–20)
BUN: 12 mg/dL (ref 6–24)
CO2: 24 mmol/L (ref 19–28)
Chloride: 97 mmol/L (ref 97–108)
GFR calc Af Amer: 109 mL/min/{1.73_m2} (ref >59)
Glucose: 122 mg/dL — ABNORMAL HIGH (ref 65–99)
Potassium: 4.3 mmol/L (ref 3.5–5.2)

## 2012-02-16 ENCOUNTER — Other Ambulatory Visit: Payer: Self-pay | Admitting: Cardiology

## 2012-04-14 ENCOUNTER — Encounter: Payer: Self-pay | Admitting: Cardiovascular Disease

## 2012-04-14 ENCOUNTER — Ambulatory Visit (INDEPENDENT_AMBULATORY_CARE_PROVIDER_SITE_OTHER): Payer: BC Managed Care – PPO | Admitting: Cardiovascular Disease

## 2012-04-14 VITALS — BP 120/82 | HR 90 | Ht 71.0 in | Wt 228.5 lb

## 2012-04-14 DIAGNOSIS — I5022 Chronic systolic (congestive) heart failure: Secondary | ICD-10-CM

## 2012-04-14 DIAGNOSIS — I2510 Atherosclerotic heart disease of native coronary artery without angina pectoris: Secondary | ICD-10-CM

## 2012-04-14 DIAGNOSIS — I10 Essential (primary) hypertension: Secondary | ICD-10-CM

## 2012-04-14 DIAGNOSIS — R0602 Shortness of breath: Secondary | ICD-10-CM

## 2012-04-14 DIAGNOSIS — R0789 Other chest pain: Secondary | ICD-10-CM

## 2012-04-14 DIAGNOSIS — I509 Heart failure, unspecified: Secondary | ICD-10-CM

## 2012-04-14 MED ORDER — RANOLAZINE ER 500 MG PO TB12: 500.0000 mg | ORAL_TABLET | Freq: Two times a day (BID) | ORAL | Status: DC

## 2012-04-14 NOTE — Patient Instructions (Addendum)
Stop Digoxin.  Start Ranexa 500 mg twice daily.  Follow up in 1 month.

## 2012-04-14 NOTE — Progress Notes (Signed)
HPI  [name] is a 56 year old male who is here today for a followup visit. He has known history of coronary artery disease status post stenting of both LAD and RCA. He had an inferior myocardial infarction in 2012 which was treated with angioplasty and stent placement. He is known to have ischemic cardiomyopathy as well. He had a cardiac catheterization done in may of this year due to chest pain and dyspnea which showed patent stents with mild to moderate nonobstructive coronary artery disease overall. Ejection fraction was 35% with a left ventricular end-diastolic pressure of 19 mm mercury. He was started on a small dose Toprol as well as lisinopril.  He has not been feeling well over the last month with increased dyspnea, increased frequency of chest pain as well as dizziness. He has no energy to do anything. He might have sleep apnea and is going to have a sleep study done next month.  Allergies  Allergen Reactions  . Avelox (Moxifloxacin) Anaphylaxis  . Ciprofloxacin   . Sulfamethoxazole      Current Outpatient Prescriptions on File Prior to Visit  Medication Sig Dispense Refill  . albuterol (PROVENTIL HFA;VENTOLIN HFA) 108 (90 BASE) MCG/ACT inhaler Inhale 2 puffs into the lungs every 6 (six) hours as needed.      Marland Kitchen albuterol-ipratropium (COMBIVENT) 18-103 MCG/ACT inhaler Inhale 2 puffs into the lungs every 6 (six) hours as needed.      Marland Kitchen aspirin 81 MG tablet Take 81 mg by mouth daily.      Marland Kitchen EFFIENT 10 MG TABS TAKE 1 TABLET BY MOUTH EVERY DAY WITH A MEAL  30 tablet  6  . esomeprazole (NEXIUM) 40 MG capsule Take 40 mg by mouth daily before breakfast.      . lisinopril (PRINIVIL,ZESTRIL) 10 MG tablet Take 1 tablet (10 mg total) by mouth daily.  90 tablet  3  . metoprolol succinate (TOPROL XL) 25 MG 24 hr tablet Take 1 tablet (25 mg total) by mouth daily.  30 tablet  6  . rosuvastatin (CRESTOR) 40 MG tablet Take 40 mg by mouth daily.      Marland Kitchen spironolactone (ALDACTONE) 25 MG tablet Take  25 mg by mouth daily.      . Tamsulosin HCl (FLOMAX) 0.4 MG CAPS Take 0.4 mg by mouth daily.      Marland Kitchen tiotropium (SPIRIVA) 18 MCG inhalation capsule Place 18 mcg into inhaler and inhale daily.      Marland Kitchen zolpidem (AMBIEN) 10 MG tablet Take 10 mg by mouth at bedtime as needed.      . ranolazine (RANEXA) 500 MG 12 hr tablet Take 1 tablet (500 mg total) by mouth 2 (two) times daily.  60 tablet  0     Past Medical History  Diagnosis Date  . Diabetes mellitus   . Pure hypercholesterolemia   . Essential hypertension, benign   . COPD (chronic obstructive pulmonary disease)   . Shingles 03/2011  . Chronic systolic CHF (congestive heart failure)   . Coronary atherosclerosis     9 stents, 8-10 "mild" heart attacks. Inferior MI in 10/2010: patent LAD stent with mild disease, Mild LCX disease, RCA: 99% distal ISR, 80% mid and 70% proximal ISR. 3 DESs to RCA. 2.5 X28 Promus, 3.0 X16 and 3.5X16 . Cardiac cath 10/2011: patent stents with mild-moderate disease overall. EF 35%.      Past Surgical History  Procedure Date  . Coronary stent placement     9 stents  . Cardiac catheterization   .  Coronary angioplasty      Family History  Problem Relation Age of Onset  . Asthma      family history  . Coronary artery disease Father   . Stroke      grandparent  . Diabetes      family history  . Cancer      grandmother     History   Social History  . Marital Status: Married    Spouse Name: N/A    Number of Children: 2  . Years of Education: N/A   Occupational History  . [employer]    Social History Main Topics  . Smoking status: Former Smoker -- 1.0 packs/day for 30 years    Quit date: 01/13/2009  . Smokeless tobacco: Not on file  . Alcohol Use: No  . Drug Use: No  . Sexually Active: Not on file   Other Topics Concern  . Not on file   Social History Narrative  . No narrative on file     PHYSICAL EXAM   BP 120/82  Pulse 90  Ht 5\' 11"  (1.803 m)  Wt 228 lb 8 oz  (103.647 kg)  BMI 31.87 kg/m2  SpO2 96%  Constitutional: He is oriented to person, place, and time. He appears well-developed and well-nourished. No distress.  HENT: No nasal discharge.  Head: Normocephalic and atraumatic.  Eyes: Pupils are equal and round. Right eye exhibits no discharge. Left eye exhibits no discharge.  Neck: Normal range of motion. Neck supple. No JVD present. No thyromegaly present. No JVD Cardiovascular: Normal rate, regular rhythm, normal heart sounds and. Exam reveals no gallop and no friction rub. No murmur heard.  Pulmonary/Chest: Effort normal and diminished breath sounds. No stridor. No respiratory distress. He has no wheezes. He has no rales. He exhibits no tenderness.  Abdominal: Soft. Bowel sounds are normal. He exhibits no distension. There is no tenderness. There is no rebound and no guarding.  Musculoskeletal: Normal range of motion. He exhibits no edema and no tenderness.  Neurological: He is alert and oriented to person, place, and time. Coordination normal.  Skin: Skin is warm and dry. No rash noted. He is not diaphoretic. No erythema. No pallor.  Psychiatric: He has a normal mood and affect. His behavior is normal. Judgment and thought content normal.     ZOX:WRUEA  Rhythm  - -Old inferior infarct.   -Nonspecific ST depression   +   Nonspecific T-abnormality  -Nondiagnostic.   ABNORMAL     ASSESSMENT AND PLAN

## 2012-04-14 NOTE — Assessment & Plan Note (Signed)
His blood pressure seems to be well-controlled. He is not hypotensive. The etiology of his dizziness is not clear. We'll reevaluate this during his next visit.

## 2012-04-14 NOTE — Assessment & Plan Note (Signed)
The patient reports worsening chronic chest pain. He had cardiac catheterization done in May which showed no new obstructive disease. He did not tolerate nitroglycerin in the past due to headache. Thus, I will start him on Ranexa 500 mg twice daily. If he improves with this medication, I will increase the dose to 1000 mg twice daily in one month.

## 2012-04-14 NOTE — Assessment & Plan Note (Signed)
The patient has worsening dyspnea and chest pain. I suspect that his dyspnea is multifactorial due to COPD, chronic congestive heart failure and physical deconditioning. He does not do any exercise on a regular basis. There is no evidence of fluid overload by physical exam.  Sleep apnea might be also contributing and he is going to have a sleep study done next month with Dr.Khan.  I will stop digoxin today and continue other heart failure medications.

## 2012-04-16 ENCOUNTER — Ambulatory Visit: Payer: BC Managed Care – PPO | Admitting: Cardiovascular Disease

## 2012-05-18 ENCOUNTER — Ambulatory Visit (INDEPENDENT_AMBULATORY_CARE_PROVIDER_SITE_OTHER): Payer: BC Managed Care – PPO | Admitting: Cardiovascular Disease

## 2012-05-18 ENCOUNTER — Encounter: Payer: Self-pay | Admitting: Cardiovascular Disease

## 2012-05-18 VITALS — BP 112/84 | HR 99 | Ht 71.0 in | Wt 231.5 lb

## 2012-05-18 DIAGNOSIS — I2510 Atherosclerotic heart disease of native coronary artery without angina pectoris: Secondary | ICD-10-CM

## 2012-05-18 DIAGNOSIS — R0602 Shortness of breath: Secondary | ICD-10-CM

## 2012-05-18 DIAGNOSIS — E7800 Pure hypercholesterolemia, unspecified: Secondary | ICD-10-CM

## 2012-05-18 DIAGNOSIS — I5022 Chronic systolic (congestive) heart failure: Secondary | ICD-10-CM

## 2012-05-18 DIAGNOSIS — I509 Heart failure, unspecified: Secondary | ICD-10-CM

## 2012-05-18 MED ORDER — RANOLAZINE ER 1000 MG PO TB12: 1000.0000 mg | ORAL_TABLET | Freq: Two times a day (BID) | ORAL | Status: DC

## 2012-05-18 MED ORDER — LISINOPRIL 5 MG PO TABS: 5.0000 mg | ORAL_TABLET | Freq: Every day | ORAL | Status: DC

## 2012-05-18 NOTE — Patient Instructions (Addendum)
Decrease Lisinopril 5 mg once daily.  Increase Ranexa to 1000 mg twice daily.  Follow up in 4 months.

## 2012-05-18 NOTE — Assessment & Plan Note (Signed)
Continue high-dose Crestor.

## 2012-05-18 NOTE — Assessment & Plan Note (Signed)
His chest pain is overall improved after the addition of Ranexa. I will increase the dose today to 1000 mg twice daily.

## 2012-05-18 NOTE — Assessment & Plan Note (Signed)
He seems to be euvolemic. Due to his dizziness and borderline low blood pressure, I will decrease the dose of lisinopril to 5 mg once daily. Continue small dose Toprol and spironolactone. He will need a followup basic metabolic profile in the near future if not already done.

## 2012-05-18 NOTE — Progress Notes (Signed)
HPI  [name] is a 56 year old male who is here today for a followup visit. He has known history of coronary artery disease status post stenting of both LAD and RCA. He had an inferior myocardial infarction in 2012 which was treated with angioplasty and stent placement. He is known to have ischemic cardiomyopathy as well. He had a cardiac catheterization done in May of this year due to chest pain and dyspnea which showed patent stents with mild to moderate nonobstructive coronary artery disease overall. Ejection fraction was 35% with a left ventricular end-diastolic pressure of 19 mm mercury. He was started on a small dose Toprol as well as lisinopril.  He was not feeling well last month with increased symptoms of chest pain, dyspnea dizziness. I added Ranexa 500 mg twice daily. He has been feeling better since then. Dizziness still seems to be an issue. He reports having carotid Doppler ultrasound performed but the results are not available.  Allergies  Allergen Reactions  . Avelox (Moxifloxacin) Anaphylaxis  . Ciprofloxacin   . Sulfamethoxazole      Current Outpatient Prescriptions on File Prior to Visit  Medication Sig Dispense Refill  . albuterol (PROVENTIL HFA;VENTOLIN HFA) 108 (90 BASE) MCG/ACT inhaler Inhale 2 puffs into the lungs every 6 (six) hours as needed.      Marland Kitchen albuterol-ipratropium (COMBIVENT) 18-103 MCG/ACT inhaler Inhale 2 puffs into the lungs every 6 (six) hours as needed.      Marland Kitchen aspirin 81 MG tablet Take 81 mg by mouth daily.      Marland Kitchen EFFIENT 10 MG TABS TAKE 1 TABLET BY MOUTH EVERY DAY WITH A MEAL  30 tablet  6  . esomeprazole (NEXIUM) 40 MG capsule Take 40 mg by mouth as needed.       . metoprolol succinate (TOPROL XL) 25 MG 24 hr tablet Take 1 tablet (25 mg total) by mouth daily.  30 tablet  6  . rosuvastatin (CRESTOR) 40 MG tablet Take 40 mg by mouth daily.      Marland Kitchen spironolactone (ALDACTONE) 25 MG tablet Take 25 mg by mouth daily.      . Tamsulosin HCl (FLOMAX) 0.4 MG CAPS  Take 0.4 mg by mouth as needed.       . tiotropium (SPIRIVA) 18 MCG inhalation capsule Place 18 mcg into inhaler and inhale daily.      Marland Kitchen zolpidem (AMBIEN) 10 MG tablet Take 10 mg by mouth at bedtime as needed.         Past Medical History  Diagnosis Date  . Diabetes mellitus   . Pure hypercholesterolemia   . Essential hypertension, benign   . COPD (chronic obstructive pulmonary disease)   . Shingles 03/2011  . Chronic systolic CHF (congestive heart failure)   . Coronary atherosclerosis     9 stents, 8-10 "mild" heart attacks. Inferior MI in 10/2010: patent LAD stent with mild disease, Mild LCX disease, RCA: 99% distal ISR, 80% mid and 70% proximal ISR. 3 DESs to RCA. 2.5 X28 Promus, 3.0 X16 and 3.5X16 . Cardiac cath 10/2011: patent stents with mild-moderate disease overall. EF 35%.      Past Surgical History  Procedure Date  . Coronary stent placement     9 stents  . Cardiac catheterization   . Coronary angioplasty      Family History  Problem Relation Age of Onset  . Asthma      family history  . Coronary artery disease Father   . Stroke  grandparent  . Diabetes      family history  . Cancer      grandmother     History   Social History  . Marital Status: Married    Spouse Name: N/A    Number of Children: 2  . Years of Education: N/A   Occupational History  . [employer]    Social History Main Topics  . Smoking status: Former Smoker -- 1.0 packs/day for 30 years    Quit date: 01/13/2009  . Smokeless tobacco: Not on file  . Alcohol Use: No  . Drug Use: No  . Sexually Active: Not on file   Other Topics Concern  . Not on file   Social History Narrative  . No narrative on file     PHYSICAL EXAM   BP 112/84  Pulse 99  Ht 5\' 11"  (1.803 m)  Wt 231 lb 8 oz (105.008 kg)  BMI 32.29 kg/m2  Constitutional: He is oriented to person, place, and time. He appears well-developed and well-nourished. No distress.  HENT: No nasal  discharge.  Head: Normocephalic and atraumatic.  Eyes: Pupils are equal and round. Right eye exhibits no discharge. Left eye exhibits no discharge.  Neck: Normal range of motion. Neck supple. No JVD present. No thyromegaly present. No JVD Cardiovascular: Normal rate, regular rhythm, normal heart sounds and. Exam reveals no gallop and no friction rub. No murmur heard.  Pulmonary/Chest: Effort normal and diminished breath sounds. No stridor. No respiratory distress. He has no wheezes. He has no rales. He exhibits no tenderness.  Abdominal: Soft. Bowel sounds are normal. He exhibits no distension. There is no tenderness. There is no rebound and no guarding.  Musculoskeletal: Normal range of motion. He exhibits no edema and no tenderness.  Neurological: He is alert and oriented to person, place, and time. Coordination normal.  Skin: Skin is warm and dry. No rash noted. He is not diaphoretic. No erythema. No pallor.  Psychiatric: He has a normal mood and affect. His behavior is normal. Judgment and thought content normal.     ZOX:WRUEA  Rhythm  - -Old inferior infarct.   ABNORMAL     ASSESSMENT AND PLAN

## 2012-09-17 ENCOUNTER — Ambulatory Visit (INDEPENDENT_AMBULATORY_CARE_PROVIDER_SITE_OTHER): Payer: BC Managed Care – PPO | Admitting: Cardiovascular Disease

## 2012-09-17 ENCOUNTER — Encounter: Payer: Self-pay | Admitting: Cardiovascular Disease

## 2012-09-17 VITALS — BP 130/70 | HR 81 | Ht 71.0 in | Wt 238.5 lb

## 2012-09-17 DIAGNOSIS — I2510 Atherosclerotic heart disease of native coronary artery without angina pectoris: Secondary | ICD-10-CM

## 2012-09-17 DIAGNOSIS — I509 Heart failure, unspecified: Secondary | ICD-10-CM

## 2012-09-17 DIAGNOSIS — R0602 Shortness of breath: Secondary | ICD-10-CM

## 2012-09-17 DIAGNOSIS — I5022 Chronic systolic (congestive) heart failure: Secondary | ICD-10-CM

## 2012-09-17 DIAGNOSIS — I10 Essential (primary) hypertension: Secondary | ICD-10-CM

## 2012-09-17 NOTE — Assessment & Plan Note (Signed)
Blood pressure is reasonably controlled. 

## 2012-09-17 NOTE — Patient Instructions (Addendum)
Continue same medications.  Follow up in 6 months.  

## 2012-09-17 NOTE — Assessment & Plan Note (Signed)
He seems to be stable from a cardiac standpoint overall. Blood pressure is reasonably controlled. Continue current medications. He is tolerating treatment with Ranexa. His biggest issue seems to be continued shortness of breath likely related to underlying COPD.

## 2012-09-17 NOTE — Assessment & Plan Note (Signed)
He appears to be euvolemic. He has minimal lower extremity edema. He was on spironolactone but is no longer on this. He is currently on small dose lisinopril and Toprol. I haven't been able to increase the dose of these medications due to continued dizziness.

## 2012-09-17 NOTE — Progress Notes (Signed)
HPI  [name] is a 57 year old male who is here today for a followup visit. He has known history of coronary artery disease status post stenting of both LAD and RCA. He had an inferior myocardial infarction in 2012 which was treated with angioplasty and stent placement. He is known to have ischemic cardiomyopathy as well. He had a cardiac catheterization done in May of 2013  due to chest pain and dyspnea which showed patent stents with mild to moderate nonobstructive coronary artery disease overall. Ejection fraction was 35% with a left ventricular end-diastolic pressure of 19 mm mercury.  He has been overall stable from a cardiac standpoint. He continues to have significant dyspnea which seems to be related to his COPD. He had a recent upper respiratory tract infection and has been on amoxicillin. He denies any chest pain.  Allergies  Allergen Reactions  . Avelox (Moxifloxacin) Anaphylaxis  . Ciprofloxacin   . Sulfamethoxazole      Current Outpatient Prescriptions on File Prior to Visit  Medication Sig Dispense Refill  . albuterol (PROVENTIL HFA;VENTOLIN HFA) 108 (90 BASE) MCG/ACT inhaler Inhale 2 puffs into the lungs every 6 (six) hours as needed.      Marland Kitchen albuterol-ipratropium (COMBIVENT) 18-103 MCG/ACT inhaler Inhale 2 puffs into the lungs every 6 (six) hours as needed.      Marland Kitchen aspirin 81 MG tablet Take 81 mg by mouth daily.      Marland Kitchen EFFIENT 10 MG TABS TAKE 1 TABLET BY MOUTH EVERY DAY WITH A MEAL  30 tablet  6  . esomeprazole (NEXIUM) 40 MG capsule Take 40 mg by mouth as needed.       Marland Kitchen lisinopril (PRINIVIL,ZESTRIL) 5 MG tablet Take 1 tablet (5 mg total) by mouth daily.  30 tablet  6  . metoprolol succinate (TOPROL XL) 25 MG 24 hr tablet Take 1 tablet (25 mg total) by mouth daily.  30 tablet  6  . ranolazine (RANEXA) 1000 MG SR tablet Take 1 tablet (1,000 mg total) by mouth 2 (two) times daily.  60 tablet  6  . rosuvastatin (CRESTOR) 40 MG tablet Take 40 mg by mouth daily.      .  Tamsulosin HCl (FLOMAX) 0.4 MG CAPS Take 0.4 mg by mouth as needed.       . tiotropium (SPIRIVA) 18 MCG inhalation capsule Place 18 mcg into inhaler and inhale daily.      Marland Kitchen zolpidem (AMBIEN) 10 MG tablet Take 10 mg by mouth at bedtime as needed.       No current facility-administered medications on file prior to visit.     Past Medical History  Diagnosis Date  . Diabetes mellitus   . Pure hypercholesterolemia   . Essential hypertension, benign   . COPD (chronic obstructive pulmonary disease)   . Shingles 03/2011  . Chronic systolic CHF (congestive heart failure)   . Coronary atherosclerosis     9 stents, 8-10 "mild" heart attacks. Inferior MI in 10/2010: patent LAD stent with mild disease, Mild LCX disease, RCA: 99% distal ISR, 80% mid and 70% proximal ISR. 3 DESs to RCA. 2.5 X28 Promus, 3.0 X16 and 3.5X16 . Cardiac cath 10/2011: patent stents with mild-moderate disease overall. EF 35%.      Past Surgical History  Procedure Laterality Date  . Coronary stent placement      9 stents  . Cardiac catheterization    . Coronary angioplasty       Family History  Problem Relation Age of  Onset  . Asthma      family history  . Coronary artery disease Father   . Stroke      grandparent  . Diabetes      family history  . Cancer      grandmother     History   Social History  . Marital Status: Married    Spouse Name: N/A    Number of Children: 2  . Years of Education: N/A   Occupational History  . [employer]    Social History Main Topics  . Smoking status: Former Smoker -- 1.00 packs/day for 30 years    Quit date: 01/13/2009  . Smokeless tobacco: Not on file  . Alcohol Use: No  . Drug Use: No  . Sexually Active: Not on file   Other Topics Concern  . Not on file   Social History Narrative  . No narrative on file     PHYSICAL EXAM   BP 130/70  Pulse 81  Ht 5\' 11"  (1.803 m)  Wt 238 lb 8 oz (108.183 kg)  BMI 33.28 kg/m2  SpO2  95%  Constitutional: He is oriented to person, place, and time. He appears well-developed and well-nourished. No distress.  HENT: No nasal discharge.  Head: Normocephalic and atraumatic.  Eyes: Pupils are equal and round. Right eye exhibits no discharge. Left eye exhibits no discharge.  Neck: Normal range of motion. Neck supple. No JVD present. No thyromegaly present. No JVD Cardiovascular: Normal rate, regular rhythm, normal heart sounds and. Exam reveals no gallop and no friction rub. No murmur heard.  Pulmonary/Chest: Effort normal and diminished breath sounds. No stridor. No respiratory distress. He has no wheezes. He has no rales. He exhibits no tenderness.  Abdominal: Soft. Bowel sounds are normal. He exhibits no distension. There is no tenderness. There is no rebound and no guarding.  Musculoskeletal: Normal range of motion. He exhibits no edema and no tenderness.  Neurological: He is alert and oriented to person, place, and time. Coordination normal.  Skin: Skin is warm and dry. No rash noted. He is not diaphoretic. No erythema. No pallor.  Psychiatric: He has a normal mood and affect. His behavior is normal. Judgment and thought content normal.     ZOX:WRUEA  Rhythm  -IVCD  -Old inferior infarct.   -  Nonspecific T-abnormality.   ABNORMAL     ASSESSMENT AND PLAN

## 2013-03-18 ENCOUNTER — Ambulatory Visit: Payer: BC Managed Care – PPO | Admitting: Cardiovascular Disease

## 2013-03-19 ENCOUNTER — Encounter: Payer: Self-pay | Admitting: Cardiovascular Disease

## 2013-03-19 ENCOUNTER — Ambulatory Visit (INDEPENDENT_AMBULATORY_CARE_PROVIDER_SITE_OTHER): Payer: BC Managed Care – PPO | Admitting: Cardiovascular Disease

## 2013-03-19 VITALS — BP 118/94 | HR 103 | Ht 71.0 in | Wt 234.5 lb

## 2013-03-19 DIAGNOSIS — R0609 Other forms of dyspnea: Secondary | ICD-10-CM

## 2013-03-19 DIAGNOSIS — I509 Heart failure, unspecified: Secondary | ICD-10-CM

## 2013-03-19 DIAGNOSIS — R0600 Dyspnea, unspecified: Secondary | ICD-10-CM

## 2013-03-19 DIAGNOSIS — I2510 Atherosclerotic heart disease of native coronary artery without angina pectoris: Secondary | ICD-10-CM

## 2013-03-19 DIAGNOSIS — I5022 Chronic systolic (congestive) heart failure: Secondary | ICD-10-CM

## 2013-03-19 DIAGNOSIS — R079 Chest pain, unspecified: Secondary | ICD-10-CM

## 2013-03-19 DIAGNOSIS — E7800 Pure hypercholesterolemia, unspecified: Secondary | ICD-10-CM

## 2013-03-19 MED ORDER — PANTOPRAZOLE SODIUM 40 MG PO TBEC: 40.0000 mg | DELAYED_RELEASE_TABLET | Freq: Every day | ORAL | Status: DC

## 2013-03-19 MED ORDER — CLOPIDOGREL BISULFATE 75 MG PO TABS: 75.0000 mg | ORAL_TABLET | Freq: Every day | ORAL | Status: AC

## 2013-03-19 NOTE — Assessment & Plan Note (Signed)
He had one episode of chest pain recently that responded to nitroglycerin. Current EKG does not show any new changes. I asked him to continue monitoring his symptoms and let us know if he develops more episodes. Given that his most recent PCI was more than one year ago, I will switch Effient to Plavix 75 mg once daily.  Nexium Was switched to Protonix due to interaction with Plavix.

## 2013-03-19 NOTE — Patient Instructions (Addendum)
Your physician has requested that you have an echocardiogram. Echocardiography is a painless test that uses sound waves to create images of your heart. It provides your doctor with information about the size and shape of your heart and how well your heart's chambers and valves are working. This procedure takes approximately one hour. There are no restrictions for this procedure.  Call us back to let us know if you are taking Metoprolol (Toprol) and if so what dose.  Make the following changes to your medications: Stop Effient and Nexium Start Plavix 75 mg once daily and Protonix 40 mg once daily.   Your physician wants you to follow-up in: 4 months.  You will receive a reminder letter in the mail two months in advance. If you don't receive a letter, please call our office to schedule the follow-up appointment.

## 2013-03-19 NOTE — Progress Notes (Signed)
Primary care physician: Dr. Dossie Arbour Primary pulmonologist: Dr. Welton Flakes   HPI  [name] is a 57 year old male who is here today for a followup visit. He has known history of coronary artery disease status post stenting of both LAD and RCA. He had an inferior myocardial infarction in 2012 which was treated with angioplasty and stent placement. He is known to have ischemic cardiomyopathy as well. He had a cardiac catheterization done in May of 2013  due to chest pain and dyspnea which showed patent stents with mild to moderate nonobstructive coronary artery disease overall. Ejection fraction was 35% with a left ventricular end-diastolic pressure of 19 mm mercury.  He has known history of severe COPD with significant exertional dyspnea. He has chronic chest pain that responded to treatment with Ranexa. He reports worsening exertional dyspnea and one episode of substernal tightness 2 nights ago which required nitroglycerin. No chest pain since then.  Allergies  Allergen Reactions  . Avelox [Moxifloxacin] Anaphylaxis  . Ciprofloxacin   . Sulfamethoxazole      Current Outpatient Prescriptions on File Prior to Visit  Medication Sig Dispense Refill  . albuterol (PROVENTIL HFA;VENTOLIN HFA) 108 (90 BASE) MCG/ACT inhaler Inhale 2 puffs into the lungs every 6 (six) hours as needed.      Marland Kitchen albuterol-ipratropium (COMBIVENT) 18-103 MCG/ACT inhaler Inhale 2 puffs into the lungs every 6 (six) hours as needed.      Marland Kitchen aspirin 81 MG tablet Take 81 mg by mouth daily.      Marland Kitchen EFFIENT 10 MG TABS TAKE 1 TABLET BY MOUTH EVERY DAY WITH A MEAL  30 tablet  6  . esomeprazole (NEXIUM) 40 MG capsule Take 40 mg by mouth as needed.       Marland Kitchen lisinopril (PRINIVIL,ZESTRIL) 5 MG tablet Take 1 tablet (5 mg total) by mouth daily.  30 tablet  6  . metoprolol succinate (TOPROL XL) 25 MG 24 hr tablet Take 1 tablet (25 mg total) by mouth daily.  30 tablet  6  . NON FORMULARY Oxygen 2.5 liters.      . ranolazine (RANEXA) 1000 MG SR  tablet Take 1 tablet (1,000 mg total) by mouth 2 (two) times daily.  60 tablet  6  . tiotropium (SPIRIVA) 18 MCG inhalation capsule Place 18 mcg into inhaler and inhale daily.      Marland Kitchen zolpidem (AMBIEN) 10 MG tablet Take 10 mg by mouth at bedtime as needed.      [name] 625 MG tablet Takes 3 tablets twice daily.       No current facility-administered medications on file prior to visit.     Past Medical History  Diagnosis Date  . Diabetes mellitus   . Pure hypercholesterolemia   . Essential hypertension, benign   . COPD (chronic obstructive pulmonary disease)   . Shingles 03/2011  . Chronic systolic CHF (congestive heart failure)   . Coronary atherosclerosis     9 stents, 8-10 "mild" heart attacks. Inferior MI in 10/2010: patent LAD stent with mild disease, Mild LCX disease, RCA: 99% distal ISR, 80% mid and 70% proximal ISR. 3 DESs to RCA. 2.5 X28 Promus, 3.0 X16 and 3.5X16 . Cardiac cath 10/2011: patent stents with mild-moderate disease overall. EF 35%.   . Pneumonia     hx     Past Surgical History  Procedure Laterality Date  . Coronary stent placement      9 stents  . Cardiac catheterization    . Coronary angioplasty  Family History  Problem Relation Age of Onset  . Asthma      family history  . Coronary artery disease Father   . Stroke      grandparent  . Diabetes      family history  . Cancer      grandmother     History   Social History  . Marital Status: Married    Spouse Name: N/A    Number of Children: 2  . Years of Education: N/A   Occupational History  . [employer]    Social History Main Topics  . Smoking status: Former Smoker -- 1.00 packs/day for 30 years    Quit date: 01/13/2009  . Smokeless tobacco: Not on file  . Alcohol Use: No  . Drug Use: No  . Sexual Activity: Not on file   Other Topics Concern  . Not on file   Social History Narrative  . No narrative on file     PHYSICAL EXAM   BP 118/94  Pulse 103   Ht 5\' 11"  (1.803 m)  Wt 234 lb 8 oz (106.369 kg)  BMI 32.72 kg/m2  Constitutional: He is oriented to person, place, and time. He appears well-developed and well-nourished. No distress.  HENT: No nasal discharge.  Head: Normocephalic and atraumatic.  Eyes: Pupils are equal and round. Right eye exhibits no discharge. Left eye exhibits no discharge.  Neck: Normal range of motion. Neck supple. No JVD present. No thyromegaly present. No JVD Cardiovascular: Normal rate, regular rhythm, normal heart sounds and. Exam reveals no gallop and no friction rub. No murmur heard.  Pulmonary/Chest: Effort normal and diminished breath sounds. No stridor. No respiratory distress. He has no wheezes. He has no rales. He exhibits no tenderness.  Abdominal: Soft. Bowel sounds are normal. He exhibits no distension. There is no tenderness. There is no rebound and no guarding.  Musculoskeletal: Normal range of motion. He exhibits no edema and no tenderness.  Neurological: He is alert and oriented to person, place, and time. Coordination normal.  Skin: Skin is warm and dry. No rash noted. He is not diaphoretic. No erythema. No pallor.  Psychiatric: He has a normal mood and affect. His behavior is normal. Judgment and thought content normal.     ZOX:WRUEA  Tachycardia  -Short PR syndrome  PRi = 114 -Nonspecific QRS widening.   -Old inferior infarct.   -  Nonspecific T-abnormality.   ABNORMAL       ASSESSMENT AND PLAN

## 2013-03-19 NOTE — Assessment & Plan Note (Signed)
He is supposed to be on Crestor but I don't see that on his list. Given his known history of coronary artery disease, treatment with a statin is recommended. It appears that he was switched to Navos. I will forward this to Dr. Dossie Arbour. It is possible that he was switched due to side effects.

## 2013-03-19 NOTE — Assessment & Plan Note (Signed)
He reports worsening exertional dyspnea. I will request an echocardiogram to evaluate his LV systolic function. Previous ejection fraction was 35% by cath. Dyspnea might be related to his COPD. It's not clear if he is taking metoprolol or not. I asked him to call us back and verify. We might have to increase the dose.

## 2013-04-06 ENCOUNTER — Other Ambulatory Visit: Payer: Self-pay

## 2013-04-06 ENCOUNTER — Other Ambulatory Visit (INDEPENDENT_AMBULATORY_CARE_PROVIDER_SITE_OTHER): Payer: BC Managed Care – PPO | Admitting: Cardiology

## 2013-04-06 DIAGNOSIS — I509 Heart failure, unspecified: Secondary | ICD-10-CM

## 2013-04-06 DIAGNOSIS — R079 Chest pain, unspecified: Secondary | ICD-10-CM

## 2013-04-06 DIAGNOSIS — R0602 Shortness of breath: Secondary | ICD-10-CM

## 2013-04-06 DIAGNOSIS — R0600 Dyspnea, unspecified: Secondary | ICD-10-CM

## 2013-04-06 DIAGNOSIS — I2510 Atherosclerotic heart disease of native coronary artery without angina pectoris: Secondary | ICD-10-CM

## 2013-04-16 ENCOUNTER — Encounter: Payer: Self-pay | Admitting: Cardiovascular Disease

## 2013-04-16 ENCOUNTER — Ambulatory Visit (INDEPENDENT_AMBULATORY_CARE_PROVIDER_SITE_OTHER): Payer: BC Managed Care – PPO | Admitting: Cardiovascular Disease

## 2013-04-16 VITALS — BP 114/86 | HR 78 | Ht 71.0 in | Wt 233.8 lb

## 2013-04-16 DIAGNOSIS — I2510 Atherosclerotic heart disease of native coronary artery without angina pectoris: Secondary | ICD-10-CM

## 2013-04-16 DIAGNOSIS — I5022 Chronic systolic (congestive) heart failure: Secondary | ICD-10-CM

## 2013-04-16 DIAGNOSIS — E7800 Pure hypercholesterolemia, unspecified: Secondary | ICD-10-CM

## 2013-04-16 DIAGNOSIS — I509 Heart failure, unspecified: Secondary | ICD-10-CM

## 2013-04-16 MED ORDER — LISINOPRIL 5 MG PO TABS: 5.0000 mg | ORAL_TABLET | Freq: Every day | ORAL | Status: DC

## 2013-04-16 MED ORDER — METOPROLOL SUCCINATE ER 25 MG PO TB24: 25.0000 mg | ORAL_TABLET | Freq: Every day | ORAL | Status: AC

## 2013-04-16 NOTE — Patient Instructions (Signed)
Resume Metoprolol ER 25 mg once daily and Lisinopril 5 mg once daily.   Follow up in 3 months.

## 2013-04-16 NOTE — Progress Notes (Signed)
Primary care physician: Dr. Dossie Arbour Primary pulmonologist: Dr. Welton Flakes   HPI  [name] is a 57 year old male who is here today for a followup visit. He has known history of coronary artery disease status post stenting of both LAD and RCA. He had an inferior myocardial infarction in 2012 which was treated with angioplasty and stent placement. He is known to have ischemic cardiomyopathy as well. He had a cardiac catheterization done in May of 2013  due to chest pain and dyspnea which showed patent stents with mild to moderate nonobstructive coronary artery disease overall. Ejection fraction was 35% with a left ventricular end-diastolic pressure of 19 mm mercury.  He has known history of severe COPD with significant exertional dyspnea. He has chronic chest pain that responded to treatment with Ranexa. He had recent worsening of dyspnea. I proceeded with an echocardiogram which showed an ejection fraction of 30-35% with severe inferior wall hypokinesis. He is not taking metoprolol or lisinopril as he ran out. He had recent gynecomastia of unclear etiology with negative workup.  Allergies  Allergen Reactions  . Avelox [Moxifloxacin] Anaphylaxis  . Ciprofloxacin   . Sulfamethoxazole      Current Outpatient Prescriptions on File Prior to Visit  Medication Sig Dispense Refill  . albuterol (PROVENTIL HFA;VENTOLIN HFA) 108 (90 BASE) MCG/ACT inhaler Inhale 2 puffs into the lungs every 6 (six) hours as needed.      Marland Kitchen albuterol-ipratropium (COMBIVENT) 18-103 MCG/ACT inhaler Inhale 2 puffs into the lungs every 6 (six) hours as needed.      Marland Kitchen aspirin 81 MG tablet Take 81 mg by mouth daily.      . clopidogrel (PLAVIX) 75 MG tablet Take 1 tablet (75 mg total) by mouth daily.  30 tablet  11  . NON FORMULARY Oxygen 2.5 liters.      . ranolazine (RANEXA) 1000 MG SR tablet Take 1 tablet (1,000 mg total) by mouth 2 (two) times daily.  60 tablet  6  . tiotropium (SPIRIVA) 18 MCG inhalation capsule Place 18 mcg  into inhaler and inhale daily.      Marland Kitchen zolpidem (AMBIEN) 10 MG tablet Take 10 mg by mouth at bedtime as needed.      Marland Kitchen lisinopril (PRINIVIL,ZESTRIL) 5 MG tablet Take 1 tablet (5 mg total) by mouth daily.  30 tablet  6  . metoprolol succinate (TOPROL XL) 25 MG 24 hr tablet Take 1 tablet (25 mg total) by mouth daily.  30 tablet  6  . WELCHOL 625 MG tablet Takes 3 tablets twice daily.       No current facility-administered medications on file prior to visit.     Past Medical History  Diagnosis Date  . Diabetes mellitus   . Pure hypercholesterolemia   . Essential hypertension, benign   . COPD (chronic obstructive pulmonary disease)   . Shingles 03/2011  . Chronic systolic CHF (congestive heart failure)   . Coronary atherosclerosis     9 stents, 8-10 "mild" heart attacks. Inferior MI in 10/2010: patent LAD stent with mild disease, Mild LCX disease, RCA: 99% distal ISR, 80% mid and 70% proximal ISR. 3 DESs to RCA. 2.5 X28 Promus, 3.0 X16 and 3.5X16 . Cardiac cath 10/2011: patent stents with mild-moderate disease overall. EF 35%.   . Pneumonia     hx     Past Surgical History  Procedure Laterality Date  . Coronary stent placement      9 stents  . Cardiac catheterization    . Coronary angioplasty  Family History  Problem Relation Age of Onset  . Asthma      family history  . Coronary artery disease Father   . Stroke      grandparent  . Diabetes      family history  . Cancer      grandmother     History   Social History  . Marital Status: Married    Spouse Name: N/A    Number of Children: 2  . Years of Education: N/A   Occupational History  . [employer]    Social History Main Topics  . Smoking status: Former Smoker -- 1.00 packs/day for 30 years    Quit date: 01/13/2009  . Smokeless tobacco: Not on file  . Alcohol Use: No  . Drug Use: No  . Sexual Activity: Not on file   Other Topics Concern  . Not on file   Social History Narrative  .  No narrative on file     PHYSICAL EXAM   BP 114/86  Pulse 78  Ht 5\' 11"  (1.803 m)  Wt 233 lb 12 oz (106.028 kg)  BMI 32.62 kg/m2  Constitutional: He is oriented to person, place, and time. He appears well-developed and well-nourished. No distress.  HENT: No nasal discharge.  Head: Normocephalic and atraumatic.  Eyes: Pupils are equal and round. Right eye exhibits no discharge. Left eye exhibits no discharge.  Neck: Normal range of motion. Neck supple. No JVD present. No thyromegaly present. No JVD Cardiovascular: Normal rate, regular rhythm, normal heart sounds and. Exam reveals no gallop and no friction rub. No murmur heard.  Pulmonary/Chest: Effort normal and diminished breath sounds. No stridor. No respiratory distress. He has no wheezes. He has no rales. He exhibits no tenderness.  Abdominal: Soft. Bowel sounds are normal. He exhibits no distension. There is no tenderness. There is no rebound and no guarding.  Musculoskeletal: Normal range of motion. He exhibits no edema and no tenderness.  Neurological: He is alert and oriented to person, place, and time. Coordination normal.  Skin: Skin is warm and dry. No rash noted. He is not diaphoretic. No erythema. No pallor.  Psychiatric: He has a normal mood and affect. His behavior is normal. Judgment and thought content normal.        ASSESSMENT AND PLAN

## 2013-04-16 NOTE — Assessment & Plan Note (Signed)
He is currently on Welchol which is being managed by Dr. Dossie Arbour.

## 2013-04-16 NOTE — Assessment & Plan Note (Signed)
He appears to be euvolemic. Recent ejection fraction was felt to 35%. I talked with him about the importance of taking a beta blocker and an ACE inhibitor in order to improve his LV systolic function. I refilled small dose Toprol and lisinopril. His blood pressure is likely too low to allow higher doses. We discussed briefly the indications for an ICD. I will consider repeating his echocardiogram after 3 months of medical therapy to see if ejection fraction is still less than 35%.

## 2013-04-16 NOTE — Assessment & Plan Note (Signed)
No recurrent chest pain on current medications.

## 2013-05-03 ENCOUNTER — Telehealth: Payer: Self-pay

## 2013-05-03 DIAGNOSIS — G4700 Insomnia, unspecified: Secondary | ICD-10-CM

## 2013-05-03 MED ORDER — ZOLPIDEM TARTRATE 10 MG PO TABS: 10.0000 mg | ORAL_TABLET | Freq: Every evening | ORAL | Status: AC | PRN

## 2013-05-03 NOTE — Telephone Encounter (Signed)
One time refill is fine.

## 2013-05-03 NOTE — Telephone Encounter (Signed)
Pt wife called and wants Dr Kirke Corin to refill her husband's Ambien, states DR Dossie Arbour is out of the office all week. States he just needs a 1 mo refill.

## 2013-06-10 ENCOUNTER — Other Ambulatory Visit: Payer: Self-pay | Admitting: *Deleted

## 2013-06-10 ENCOUNTER — Telehealth: Payer: Self-pay | Admitting: *Deleted

## 2013-06-10 MED ORDER — NITROGLYCERIN 0.4 MG SL SUBL: 0.4000 mg | SUBLINGUAL_TABLET | SUBLINGUAL | Status: AC | PRN

## 2013-06-10 NOTE — Telephone Encounter (Signed)
Patient wife is requesting a refill for his nitro do not see this on the med list.  Would like [facility] sent to [facility]

## 2013-06-10 NOTE — Telephone Encounter (Signed)
Pt requesting refill on nitro 0.4 sL tablet prn. Pt stated he is not having any chest pain but would like to have some handy due to him having a old bottle that is now expired. Nitro is not on pt's medications or history of being prescribed nitro. Please Advise.

## 2013-06-10 NOTE — Telephone Encounter (Signed)
x4 attempt to call no answer could not leave message.

## 2013-06-10 NOTE — Telephone Encounter (Signed)
x3 attempt to call line busy.

## 2013-06-10 NOTE — Telephone Encounter (Signed)
Requested Prescriptions   Signed Prescriptions Disp Refills  . nitroGLYCERIN (NITROSTAT) 0.4 MG SL tablet 25 tablet 6    Sig: Place 1 tablet (0.4 mg total) under the tongue every 5 (five) minutes as needed for chest pain.    Authorizing Provider: Lorine Bears A    Ordering User: Kendrick Fries

## 2013-06-10 NOTE — Telephone Encounter (Signed)
Nitroglycerine 0.4 mg sl prn. # 25 with 6 refills.

## 2013-06-10 NOTE — Telephone Encounter (Signed)
Refill sent to [facility] for nitro.

## 2013-06-23 ENCOUNTER — Telehealth: Payer: Self-pay | Admitting: *Deleted

## 2013-06-23 NOTE — Telephone Encounter (Signed)
Sherry from [facility] pre admit called to inform us that patient is having a Cystoscope with internal urethrotomy using a laser, she wanted to know if patient should stop plavix. Per Hurman Horn PA ok to stop Plavix today for procedure on Monday 06/28/13 as long as his last stent was greater than 12 months ago.

## 2013-06-24 NOTE — Telephone Encounter (Signed)
That is fine 

## 2013-06-25 ENCOUNTER — Encounter: Payer: Self-pay | Admitting: *Deleted

## 2013-06-25 NOTE — Telephone Encounter (Signed)
  Message Received: 1 day ago     Iran OuchMuhammad A Arida, MD Fransico SettersNicole E Maven Rosander, RN            He is at moderate risk from a cardiac standpoint. I prefer that he stays on Aspirin and Plavix. If it necessary to stop these, then only Plavix can be held for 5 days.      Previous Messages      Per staff message from Dr. Kirke CorinArida

## 2013-06-25 NOTE — Telephone Encounter (Signed)
Spoke with patient to let him know to only stop plavix and that his cardiac clearance  [facility]

## 2013-07-22 ENCOUNTER — Encounter: Payer: Self-pay | Admitting: Cardiovascular Disease

## 2013-07-22 ENCOUNTER — Ambulatory Visit (INDEPENDENT_AMBULATORY_CARE_PROVIDER_SITE_OTHER): Payer: BC Managed Care – PPO | Admitting: Cardiovascular Disease

## 2013-07-22 VITALS — BP 134/84 | HR 82 | Ht 71.0 in | Wt 229.5 lb

## 2013-07-22 DIAGNOSIS — I2510 Atherosclerotic heart disease of native coronary artery without angina pectoris: Secondary | ICD-10-CM

## 2013-07-22 DIAGNOSIS — I5022 Chronic systolic (congestive) heart failure: Secondary | ICD-10-CM

## 2013-07-22 DIAGNOSIS — I10 Essential (primary) hypertension: Secondary | ICD-10-CM

## 2013-07-22 DIAGNOSIS — E7800 Pure hypercholesterolemia, unspecified: Secondary | ICD-10-CM

## 2013-07-22 DIAGNOSIS — I509 Heart failure, unspecified: Secondary | ICD-10-CM

## 2013-07-22 MED ORDER — LISINOPRIL 10 MG PO TABS
10.0000 mg | ORAL_TABLET | Freq: Every day | ORAL | Status: DC
Start: 2013-07-22 — End: 2013-10-01

## 2013-07-22 NOTE — Progress Notes (Signed)
Primary care physician: Dr. Dossie Arbourrissman Primary pulmonologist: Dr. Welton FlakesKhan   HPI  [name] is a 58 year old male who is here today for a followup visit. He has known history of coronary artery disease status post stenting of both LAD and RCA. He had an inferior myocardial infarction in 2012 which was treated with angioplasty and stent placement. He is known to have ischemic cardiomyopathy as well. He had a cardiac catheterization done in May of 2013  due to chest pain and dyspnea which showed patent stents with mild to moderate nonobstructive coronary artery disease overall. Ejection fraction was 35% with a left ventricular end-diastolic pressure of 19 mm mercury.  He has known history of severe COPD with significant exertional dyspnea. He has chronic chest pain that responded to treatment with Ranexa.  Echocardiogram in 03/2013 showed an ejection fraction of 30-35% with severe inferior wall hypokinesis.  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 He is now self cathing once daily but is able to void on his own. Hematuria resolved. He is overall feeling better and denies chest pain. Dyspnea is stable.   Allergies  Allergen Reactions  . Avelox [Moxifloxacin] Anaphylaxis  . Ciprofloxacin   . Sulfamethoxazole      Current Outpatient Prescriptions on File Prior to Visit  Medication Sig Dispense Refill  . albuterol (PROVENTIL HFA;VENTOLIN HFA) 108 (90 BASE) MCG/ACT inhaler Inhale 2 puffs into the lungs every 6 (six) hours as needed.      Marland Kitchen. albuterol-ipratropium (COMBIVENT) 18-103 MCG/ACT inhaler Inhale 2 puffs into the lungs every 6 (six) hours as needed.      Marland Kitchen. aspirin 81 MG tablet Take 81 mg by mouth daily.      . clopidogrel (PLAVIX) 75 MG tablet Take 1 tablet (75 mg total) by mouth daily.  30 tablet  11  . lisinopril (PRINIVIL,ZESTRIL) 5 MG tablet Take 1 tablet (5 mg total) by mouth daily.  30 tablet  6  . metoprolol succinate (TOPROL-XL) 25 MG 24  hr tablet Take 1 tablet (25 mg total) by mouth daily.  30 tablet  6  . nitroGLYCERIN (NITROSTAT) 0.4 MG SL tablet Place 1 tablet (0.4 mg total) under the tongue every 5 (five) minutes as needed for chest pain.  25 tablet  6  . NON FORMULARY Oxygen 2.5 liters.      . pantoprazole (PROTONIX) 40 MG tablet Take 40 mg by mouth as needed.      . ranolazine (RANEXA) 1000 MG SR tablet Take 1 tablet (1,000 mg total) by mouth 2 (two) times daily.  60 tablet  6  . tiotropium (SPIRIVA) 18 MCG inhalation capsule Place 18 mcg into inhaler and inhale daily.      [name]. WELCHOL 625 MG tablet Takes 3 tablets twice daily.      Marland Kitchen. zolpidem (AMBIEN) 10 MG tablet Take 1 tablet (10 mg total) by mouth at bedtime as needed for sleep.  30 tablet  0   No current facility-administered medications on file prior to visit.     Past Medical History  Diagnosis Date  . Diabetes mellitus   . Pure hypercholesterolemia   . Essential hypertension, benign   . COPD (chronic obstructive pulmonary disease)   . Shingles 03/2011  . Chronic systolic CHF (congestive heart failure)   . Coronary atherosclerosis     9 stents, 8-10 "mild" heart attacks. Inferior MI in 10/2010: patent LAD stent with mild disease, Mild LCX disease, RCA: 99% distal ISR, 80% mid  and 70% proximal ISR. 3 DESs to RCA. 2.5 X28 Promus, 3.0 X16 and 3.5X16 . Cardiac cath 10/2011: patent stents with mild-moderate disease overall. EF 35%.   . Pneumonia     hx     Past Surgical History  Procedure Laterality Date  . Coronary stent placement      9 stents  . Cardiac catheterization    . Coronary angioplasty    . Urethrotomy       Family History  Problem Relation Age of Onset  . Asthma      family history  . Coronary artery disease Father   . Stroke      grandparent  . Diabetes      family history  . Cancer      grandmother     History   Social History  . Marital Status: Married    Spouse Name: N/A    Number of Children: 2  . Years of Education:  N/A   Occupational History  . [employer]    Social History Main Topics  . Smoking status: Former Smoker -- 1.00 packs/day for 30 years    Quit date: 01/13/2009  . Smokeless tobacco: Not on file  . Alcohol Use: No  . Drug Use: No  . Sexual Activity: Not on file   Other Topics Concern  . Not on file   Social History Narrative  . No narrative on file     PHYSICAL EXAM   BP 134/84  Pulse 82  Ht 5\' 11"  (1.803 m)  Wt 229 lb 8 oz (104.101 kg)  BMI 32.02 kg/m2  Constitutional: He is oriented to person, place, and time. He appears well-developed and well-nourished. No distress.  HENT: No nasal discharge.  Head: Normocephalic and atraumatic.  Eyes: Pupils are equal and round. Right eye exhibits no discharge. Left eye exhibits no discharge.  Neck: Normal range of motion. Neck supple. No JVD present. No thyromegaly present. No JVD Cardiovascular: Normal rate, regular rhythm, normal heart sounds and. Exam reveals no gallop and no friction rub. No murmur heard.  Pulmonary/Chest: Effort normal and diminished breath sounds. No stridor. No respiratory distress. He has no wheezes. He has no rales. He exhibits no tenderness.  Abdominal: Soft. Bowel sounds are normal. He exhibits no distension. There is no tenderness. There is no rebound and no guarding.  Musculoskeletal: Normal range of motion. He exhibits no edema and no tenderness.  Neurological: He is alert and oriented to person, place, and time. Coordination normal.  Skin: Skin is warm and dry. No rash noted. He is not diaphoretic. No erythema. No pallor.  Psychiatric: He has a normal mood and affect. His behavior is normal. Judgment and thought content normal.        ASSESSMENT AND PLAN

## 2013-07-22 NOTE — Assessment & Plan Note (Addendum)
He is currently on Welchol which is being managed by Dr. Dossie Arbourrissman. He is no longer on a statin. The exact reason is not unknown.

## 2013-07-22 NOTE — Assessment & Plan Note (Addendum)
Most recent ejection fraction was 30-35%. He appears to be euvolemic. Increase the dose of lisinopril to 10 mg once daily and continue treatment with Toprol. I will consider treatment with Eplerenone which we have been somewhat limited by low blood pressure. He prefers to wait to recheck his LV systolic function and determine if an ICD is indicated given that he is dealing with urethral stricture.

## 2013-07-22 NOTE — Patient Instructions (Addendum)
Your physician has recommended you make the following change in your medication:  Increase Lisinopril 10 mg once dail y   Your physician recommends that you schedule a follow-up appointment in:  4 months

## 2013-07-22 NOTE — Assessment & Plan Note (Signed)
Blood pressure is well controlled on current medications. 

## 2013-07-22 NOTE — Assessment & Plan Note (Signed)
He seems to be stable from a cardiac standpoint with controlled angina on current medications.

## 2013-09-09 ENCOUNTER — Other Ambulatory Visit: Payer: Self-pay

## 2013-09-09 ENCOUNTER — Other Ambulatory Visit: Payer: Self-pay | Admitting: Emergency Medicine

## 2013-09-12 ENCOUNTER — Other Ambulatory Visit: Payer: Self-pay | Admitting: Internal Medicine

## 2013-10-01 ENCOUNTER — Encounter: Payer: Self-pay | Admitting: Cardiovascular Disease

## 2013-10-01 ENCOUNTER — Ambulatory Visit (INDEPENDENT_AMBULATORY_CARE_PROVIDER_SITE_OTHER): Payer: BC Managed Care – PPO | Admitting: Cardiovascular Disease

## 2013-10-01 VITALS — BP 120/70 | HR 72 | Ht 71.0 in | Wt 228.0 lb

## 2013-10-01 DIAGNOSIS — I10 Essential (primary) hypertension: Secondary | ICD-10-CM

## 2013-10-01 DIAGNOSIS — I2510 Atherosclerotic heart disease of native coronary artery without angina pectoris: Secondary | ICD-10-CM

## 2013-10-01 DIAGNOSIS — I509 Heart failure, unspecified: Secondary | ICD-10-CM

## 2013-10-01 DIAGNOSIS — I5022 Chronic systolic (congestive) heart failure: Secondary | ICD-10-CM

## 2013-10-01 NOTE — Assessment & Plan Note (Signed)
Most recent ejection fraction was 35-40%. He appears to be euvolemic. Continue treatment with lisinopril and Toprol. He seems to be stable overall.

## 2013-10-01 NOTE — Assessment & Plan Note (Signed)
Blood pressure is well controlled 

## 2013-10-01 NOTE — Progress Notes (Signed)
Primary care physician: Dr. Dossie Arbour Primary pulmonologist: Dr. Welton Flakes   HPI  [name] is a 58 year old male who is here today for a followup visit. He has known history of coronary artery disease status post stenting of both LAD and RCA. He had an inferior myocardial infarction in 2012 which was treated with angioplasty and stent placement. He is known to have ischemic cardiomyopathy as well. He had a cardiac catheterization done in May of 2013  due to chest pain and dyspnea which showed patent stents with mild to moderate nonobstructive coronary artery disease overall. Ejection fraction was 35% with a left ventricular end-diastolic pressure of 19 mm mercury.  He has known history of severe COPD with significant exertional dyspnea. He has chronic chest pain that responded to treatment with Ranexa.  Echocardiogram in 03/2013 showed an ejection fraction of 30-35% with severe inferior wall hypokinesis. He had an echocardiogram done last month at [facility] which showed an ejection fraction of 35-40%. He was hospitalized in March at [facility] for MSSA bacteremia of unclear source. He was treated with 2 weeks of IV antibiotics. He has been doing well and denies chest pain. Dyspnea is stable.   Allergies  Allergen Reactions  . Avelox [Moxifloxacin] Anaphylaxis  . Ciprofloxacin   . Sulfamethoxazole      Current Outpatient Prescriptions on File Prior to Visit  Medication Sig Dispense Refill  . albuterol (PROVENTIL HFA;VENTOLIN HFA) 108 (90 BASE) MCG/ACT inhaler Inhale 2 puffs into the lungs every 6 (six) hours as needed.      Marland Kitchen albuterol-ipratropium (COMBIVENT) 18-103 MCG/ACT inhaler Inhale 2 puffs into the lungs every 6 (six) hours as needed.      Marland Kitchen aspirin 81 MG tablet Take 81 mg by mouth daily.      . clopidogrel (PLAVIX) 75 MG tablet Take 1 tablet (75 mg total) by mouth daily.  30 tablet  11  . metoprolol succinate (TOPROL-XL) 25 MG 24 hr tablet Take 1 tablet (25 mg total) by mouth daily.  30 tablet   6  . nitroGLYCERIN (NITROSTAT) 0.4 MG SL tablet Place 1 tablet (0.4 mg total) under the tongue every 5 (five) minutes as needed for chest pain.  25 tablet  6  . NON FORMULARY Oxygen 2.5 liters.      . pantoprazole (PROTONIX) 40 MG tablet Take 40 mg by mouth as needed.      . tiotropium (SPIRIVA) 18 MCG inhalation capsule Place 18 mcg into inhaler and inhale daily.      [name] 625 MG tablet Takes 3 tablets twice daily.      Marland Kitchen zolpidem (AMBIEN) 10 MG tablet Take 1 tablet (10 mg total) by mouth at bedtime as needed for sleep.  30 tablet  0   No current facility-administered medications on file prior to visit.     Past Medical History  Diagnosis Date  . Diabetes mellitus   . Pure hypercholesterolemia   . Essential hypertension, benign   . COPD (chronic obstructive pulmonary disease)   . Shingles 03/2011  . Chronic systolic CHF (congestive heart failure)   . Coronary atherosclerosis     9 stents, 8-10 "mild" heart attacks. Inferior MI in 10/2010: patent LAD stent with mild disease, Mild LCX disease, RCA: 99% distal ISR, 80% mid and 70% proximal ISR. 3 DESs to RCA. 2.5 X28 Promus, 3.0 X16 and 3.5X16 . Cardiac cath 10/2011: patent stents with mild-moderate disease overall. EF 35%.   . Pneumonia     hx  Past Surgical History  Procedure Laterality Date  . Coronary stent placement      9 stents  . Cardiac catheterization    . Coronary angioplasty    . Urethrotomy       Family History  Problem Relation Age of Onset  . Asthma      family history  . Coronary artery disease Father   . Stroke      grandparent  . Diabetes      family history  . Cancer      grandmother     History   Social History  . Marital Status: Married    Spouse Name: N/A    Number of Children: 2  . Years of Education: N/A   Occupational History  . [employer]    Social History Main Topics  . Smoking status: Former Smoker -- 1.00 packs/day for 30 years    Quit date: 01/13/2009    . Smokeless tobacco: Not on file  . Alcohol Use: No  . Drug Use: No  . Sexual Activity: Not on file   Other Topics Concern  . Not on file   Social History Narrative  . No narrative on file     PHYSICAL EXAM   BP 120/70  Pulse 72  Ht 5\' 11"  (1.803 m)  Wt 228 lb (103.42 kg)  BMI 31.81 kg/m2  Constitutional: He is oriented to person, place, and time. He appears well-developed and well-nourished. No distress.  HENT: No nasal discharge.  Head: Normocephalic and atraumatic.  Eyes: Pupils are equal and round. Right eye exhibits no discharge. Left eye exhibits no discharge.  Neck: Normal range of motion. Neck supple. No JVD present. No thyromegaly present. No JVD Cardiovascular: Normal rate, regular rhythm, normal heart sounds and. Exam reveals no gallop and no friction rub. No murmur heard.  Pulmonary/Chest: Effort normal and diminished breath sounds. No stridor. No respiratory distress. He has no wheezes. He has no rales. He exhibits no tenderness.  Abdominal: Soft. Bowel sounds are normal. He exhibits no distension. There is no tenderness. There is no rebound and no guarding.  Musculoskeletal: Normal range of motion. He exhibits no edema and no tenderness.  Neurological: He is alert and oriented to person, place, and time. Coordination normal.  Skin: Skin is warm and dry. No rash noted. He is not diaphoretic. No erythema. No pallor.  Psychiatric: He has a normal mood and affect. His behavior is normal. Judgment and thought content normal.     EKG: Normal sinus rhythm with old inferior infarct.   ASSESSMENT AND PLAN

## 2013-10-01 NOTE — Assessment & Plan Note (Signed)
He seems to be stable from a cardiac standpoint with controlled angina on current medications. Chest pain improved significantly with Ranexa.

## 2013-10-01 NOTE — Patient Instructions (Signed)
Continue same medications.   Your physician wants you to follow-up in: 6 months.  You will receive a reminder letter in the mail two months in advance. If you don't receive a letter, please call our office to schedule the follow-up appointment.  

## 2013-12-22 ENCOUNTER — Other Ambulatory Visit: Payer: Self-pay | Admitting: Emergency Medicine

## 2013-12-23 DIAGNOSIS — I059 Rheumatic mitral valve disease, unspecified: Secondary | ICD-10-CM

## 2013-12-24 ENCOUNTER — Other Ambulatory Visit: Payer: Self-pay | Admitting: Student

## 2013-12-30 ENCOUNTER — Other Ambulatory Visit: Payer: Self-pay | Admitting: Student

## 2013-12-30 DIAGNOSIS — A419 Sepsis, unspecified organism: Secondary | ICD-10-CM

## 2013-12-30 DIAGNOSIS — R6521 Severe sepsis with septic shock: Secondary | ICD-10-CM

## 2013-12-30 DIAGNOSIS — I4891 Unspecified atrial fibrillation: Secondary | ICD-10-CM

## 2013-12-30 DIAGNOSIS — J9600 Acute respiratory failure, unspecified whether with hypoxia or hypercapnia: Secondary | ICD-10-CM

## 2013-12-30 DIAGNOSIS — I5022 Chronic systolic (congestive) heart failure: Secondary | ICD-10-CM

## 2013-12-30 DIAGNOSIS — J189 Pneumonia, unspecified organism: Secondary | ICD-10-CM

## 2013-12-31 DIAGNOSIS — I4892 Unspecified atrial flutter: Secondary | ICD-10-CM

## 2014-01-01 DIAGNOSIS — N179 Acute kidney failure, unspecified: Secondary | ICD-10-CM

## 2014-01-04 DIAGNOSIS — N179 Acute kidney failure, unspecified: Secondary | ICD-10-CM

## 2014-01-04 DIAGNOSIS — I4892 Unspecified atrial flutter: Secondary | ICD-10-CM

## 2014-01-04 DIAGNOSIS — I5022 Chronic systolic (congestive) heart failure: Secondary | ICD-10-CM

## 2014-01-06 DIAGNOSIS — I059 Rheumatic mitral valve disease, unspecified: Secondary | ICD-10-CM

## 2014-01-22 DEATH — deceased

## 2016-06-06 IMAGING — CR DG CHEST 1V PORT
1 series · 1 of 1 positions shown · non-contrast
Comparison: 12/26/2013

CLINICAL DATA: Respiratory failure.

EXAM:
PORTABLE CHEST - 1 VIEW

[ap]
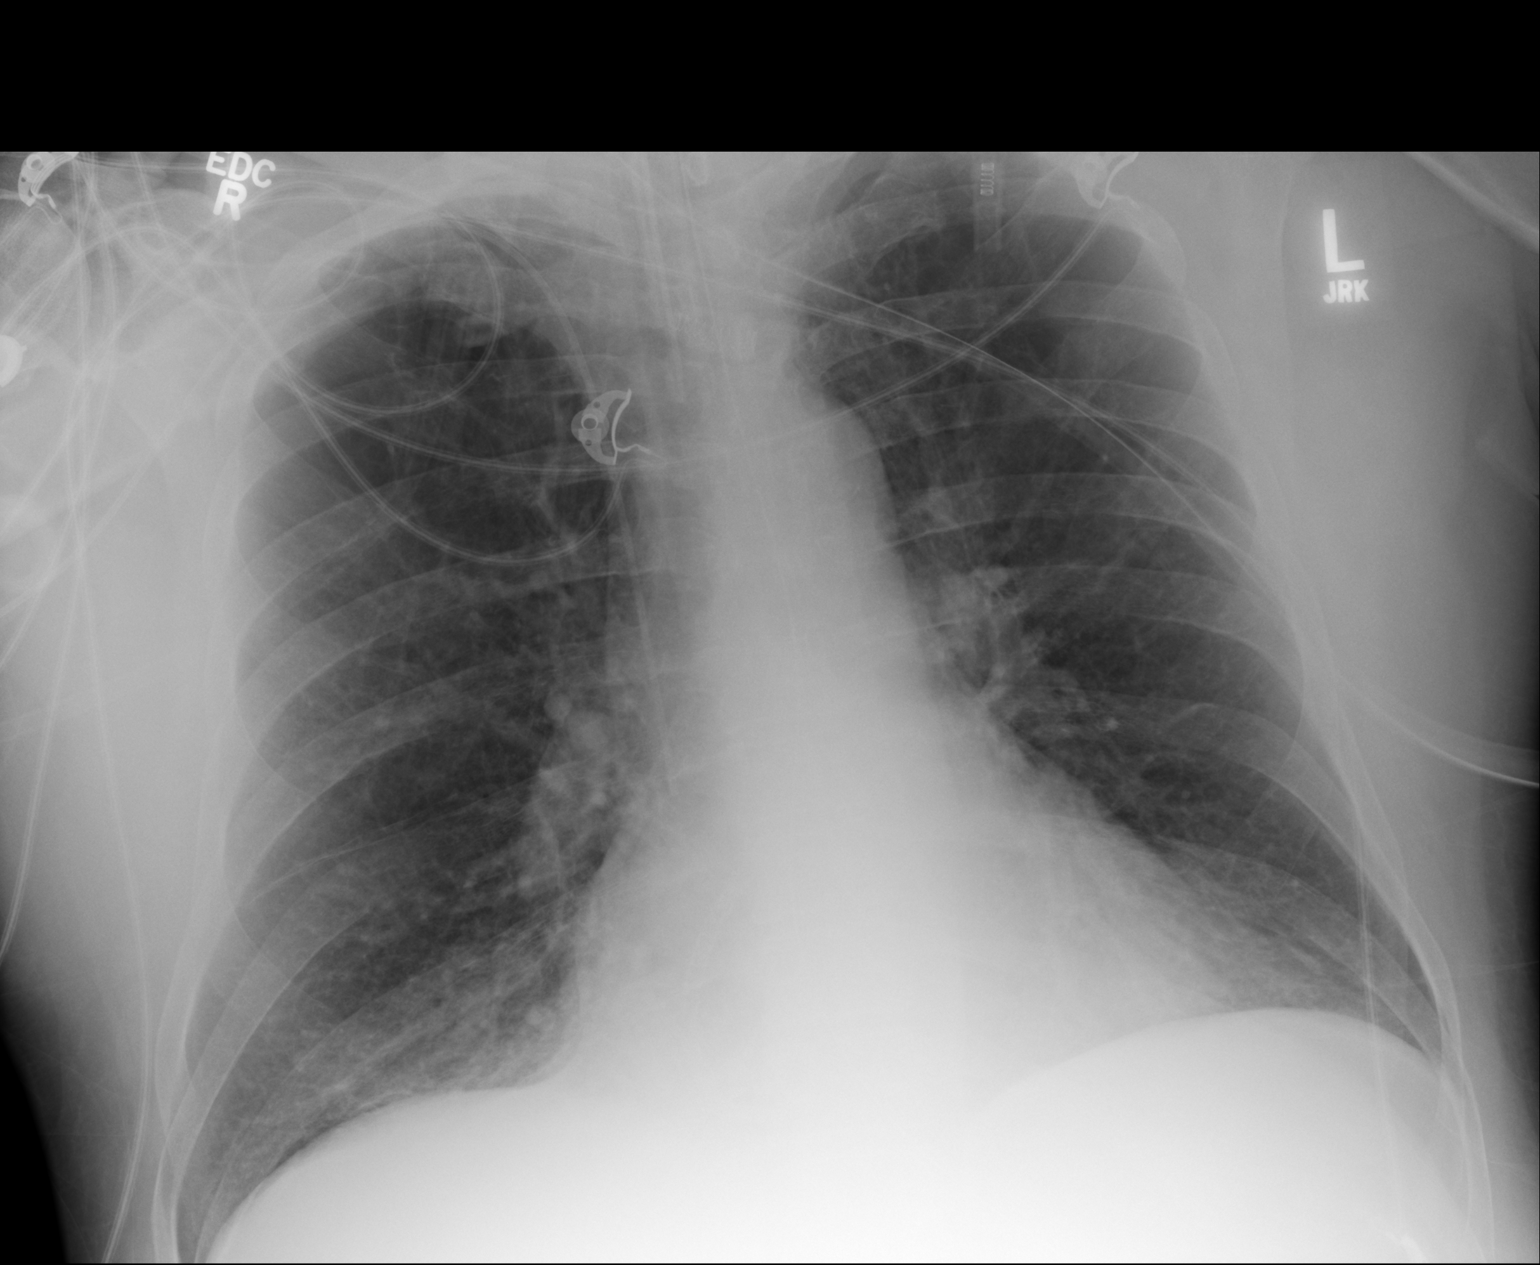

[1 of 1 positions shown; findings below may reference images not displayed]

FINDINGS: Endotracheal tube remains with the tip approximately 4 cm above the
carina. Lungs show some improvement in aeration at the right base
with some residual atelectasis/ infiltrate remaining at both bases.
Stable COPD/emphysema. The heart size is stable. Stable PICC line
positioning.
IMPRESSION: Improvement in aeration at the right lung base.

## 2016-06-09 IMAGING — CR DG CHEST 1V PORT
1 series · 1 of 1 positions shown · non-contrast
Comparison: Portable exam 0309 hr compared to 12/29/2013

CLINICAL DATA: On ventilator

EXAM:
PORTABLE CHEST - 1 VIEW

[ap]
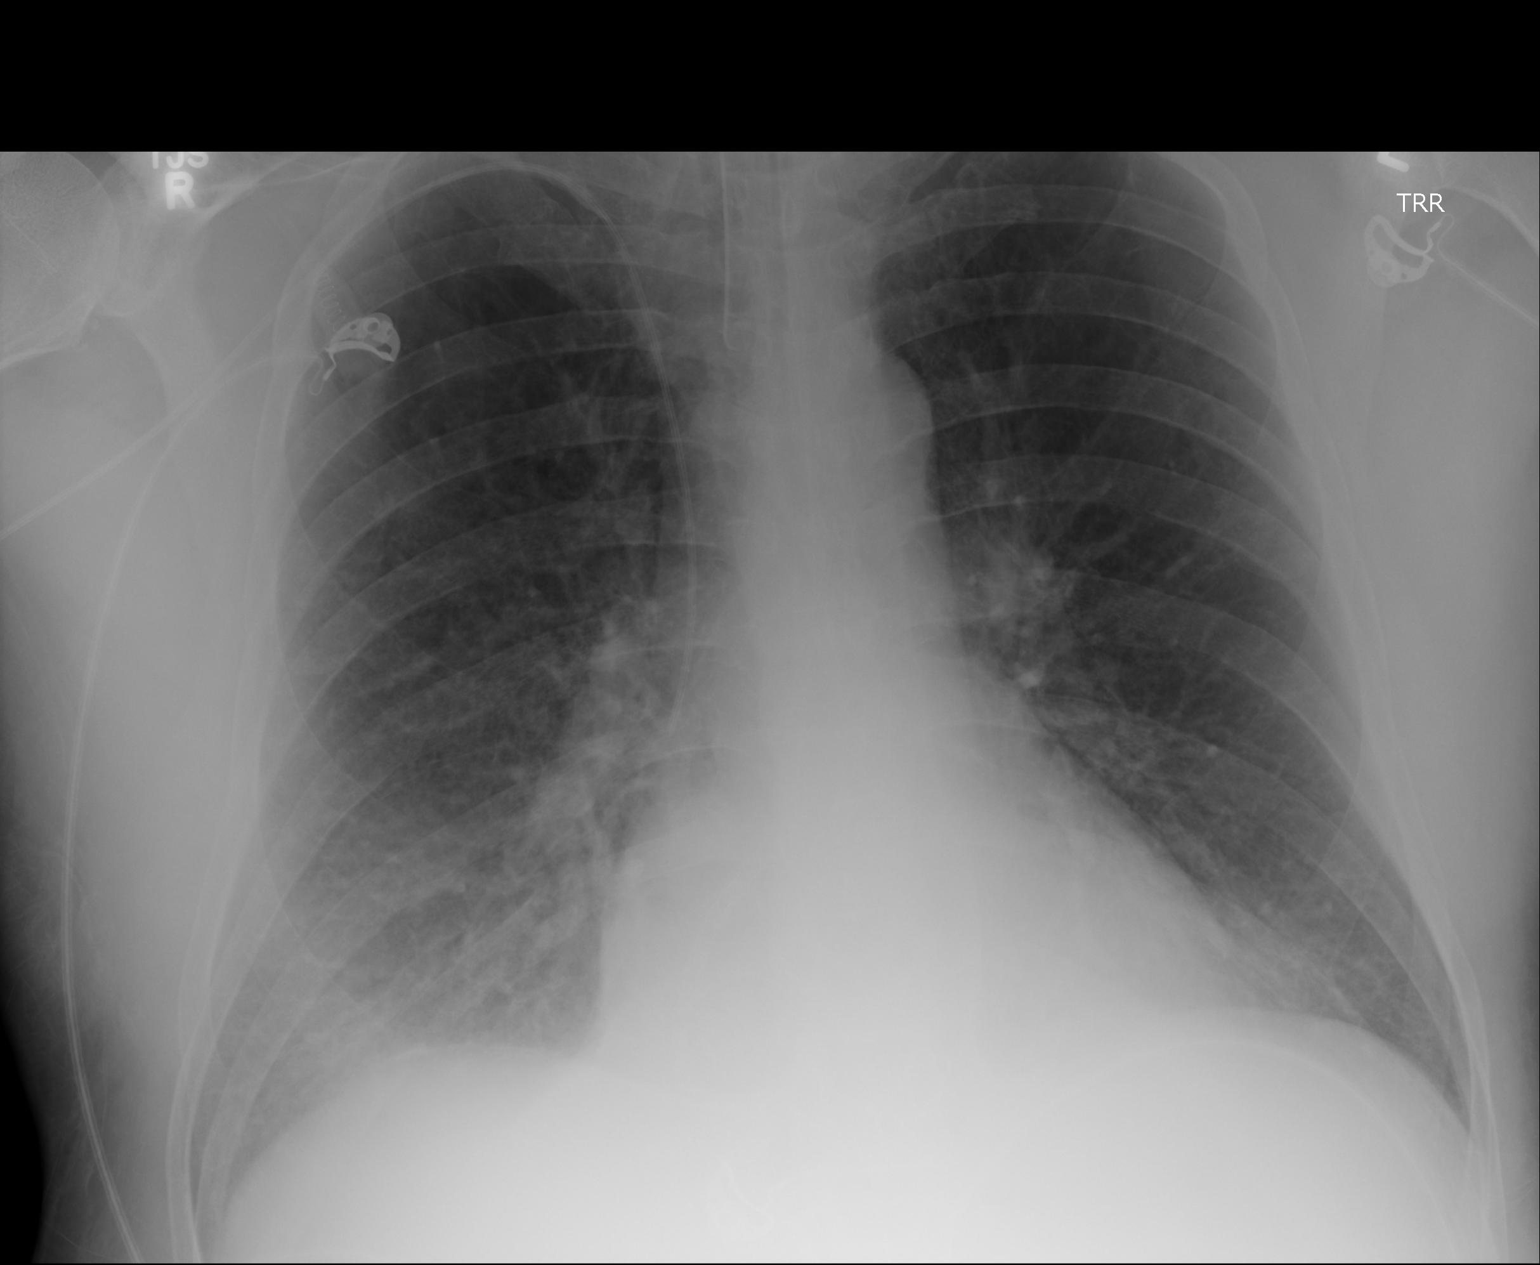

[1 of 1 positions shown; findings below may reference images not displayed]

FINDINGS: Tip of endotracheal tube projects 5.3 cm above carina.

RIGHT arm PICC line tip projects over mid to distal SVC.

Upper normal heart size.

Mediastinal contours and pulmonary vascularity normal.

Improved RIGHT lower lobe infiltrate.

Upper lungs clear.

No pleural effusion or pneumothorax.
IMPRESSION: Improved RIGHT lower lobe infiltrate.
